# Patient Record
Sex: Female | Born: 1945 | Race: Black or African American | Hispanic: No | Smoking: Never smoker
Health system: Southern US, Community
[De-identification: ages and names within clinical notes are randomized; demographics above are authoritative.]

## PROBLEM LIST (undated history)

## (undated) DIAGNOSIS — I10 Essential (primary) hypertension: Secondary | ICD-10-CM

## (undated) DIAGNOSIS — R011 Cardiac murmur, unspecified: Secondary | ICD-10-CM

## (undated) DIAGNOSIS — I421 Obstructive hypertrophic cardiomyopathy: Secondary | ICD-10-CM

## (undated) DIAGNOSIS — E119 Type 2 diabetes mellitus without complications: Secondary | ICD-10-CM

## (undated) HISTORY — DX: Cardiac murmur, unspecified: R01.1

## (undated) HISTORY — DX: Obstructive hypertrophic cardiomyopathy: I42.1

## (undated) HISTORY — DX: Essential (primary) hypertension: I10

## (undated) HISTORY — PX: ABDOMINAL HYSTERECTOMY: SHX81

## (undated) HISTORY — DX: Type 2 diabetes mellitus without complications: E11.9

---

## 2004-04-07 ENCOUNTER — Encounter: Admission: RE | Admit: 2004-04-07 | Discharge: 2004-04-07 | Payer: Self-pay | Admitting: Internal Medicine

## 2005-02-08 ENCOUNTER — Ambulatory Visit (HOSPITAL_COMMUNITY): Admission: RE | Admit: 2005-02-08 | Discharge: 2005-02-08 | Payer: Self-pay | Admitting: Gastroenterology

## 2006-03-25 ENCOUNTER — Emergency Department (HOSPITAL_COMMUNITY): Admission: EM | Admit: 2006-03-25 | Discharge: 2006-03-25 | Payer: Self-pay | Admitting: Emergency Medicine

## 2006-03-29 ENCOUNTER — Ambulatory Visit (HOSPITAL_COMMUNITY): Admission: RE | Admit: 2006-03-29 | Discharge: 2006-03-30 | Payer: Self-pay | Admitting: Orthopaedic Surgery

## 2008-12-27 ENCOUNTER — Observation Stay (HOSPITAL_COMMUNITY): Admission: EM | Admit: 2008-12-27 | Discharge: 2008-12-30 | Payer: Self-pay | Admitting: Emergency Medicine

## 2008-12-29 HISTORY — PX: CARDIAC CATHETERIZATION: SHX172

## 2009-07-27 ENCOUNTER — Encounter: Admission: RE | Admit: 2009-07-27 | Discharge: 2009-07-27 | Payer: Self-pay | Admitting: Internal Medicine

## 2010-02-08 ENCOUNTER — Emergency Department (HOSPITAL_COMMUNITY): Admission: EM | Admit: 2010-02-08 | Discharge: 2010-02-08 | Payer: Self-pay | Admitting: Emergency Medicine

## 2010-10-10 ENCOUNTER — Encounter: Payer: Self-pay | Admitting: Internal Medicine

## 2010-10-26 ENCOUNTER — Other Ambulatory Visit: Payer: Self-pay | Admitting: Internal Medicine

## 2010-10-26 DIAGNOSIS — Z1231 Encounter for screening mammogram for malignant neoplasm of breast: Secondary | ICD-10-CM

## 2010-11-04 ENCOUNTER — Ambulatory Visit
Admission: RE | Admit: 2010-11-04 | Discharge: 2010-11-04 | Disposition: A | Discharge status: Home / Self Care | Payer: BC Managed Care – PPO | Source: Ambulatory Visit | Attending: Internal Medicine | Admitting: Internal Medicine

## 2010-11-04 DIAGNOSIS — Z1231 Encounter for screening mammogram for malignant neoplasm of breast: Secondary | ICD-10-CM

## 2010-12-06 LAB — URINALYSIS, ROUTINE W REFLEX MICROSCOPIC
Bilirubin Urine: NEGATIVE
Glucose, UA: NEGATIVE mg/dL
Hgb urine dipstick: NEGATIVE
Protein, ur: NEGATIVE mg/dL
Specific Gravity, Urine: 1.022 (ref 1.005–1.030)

## 2010-12-29 LAB — COMPREHENSIVE METABOLIC PANEL
ALT: 22 U/L (ref 0–35)
AST: 17 U/L (ref 0–37)
BUN: 24 mg/dL — ABNORMAL HIGH (ref 6–23)
BUN: 26 mg/dL — ABNORMAL HIGH (ref 6–23)
CO2: 24 mEq/L (ref 19–32)
Calcium: 8.8 mg/dL (ref 8.4–10.5)
Chloride: 103 mEq/L (ref 96–112)
Creatinine, Ser: 1.11 mg/dL (ref 0.4–1.2)
Creatinine, Ser: 1.12 mg/dL (ref 0.4–1.2)
GFR calc Af Amer: 60 mL/min — ABNORMAL LOW (ref >60)
GFR calc Af Amer: >60 mL/min (ref >60)
GFR calc non Af Amer: 50 mL/min — ABNORMAL LOW (ref >60)
Glucose, Bld: 228 mg/dL — ABNORMAL HIGH (ref 70–99)
Potassium: 5.7 mEq/L — ABNORMAL HIGH (ref 3.5–5.1)
Total Bilirubin: 0.5 mg/dL (ref 0.3–1.2)
Total Bilirubin: 1.3 mg/dL — ABNORMAL HIGH (ref 0.3–1.2)
Total Protein: 5.8 g/dL — ABNORMAL LOW (ref 6.0–8.3)

## 2010-12-29 LAB — POCT CARDIAC MARKERS
CKMB, poc: 2.8 ng/mL (ref 1.0–8.0)
Troponin i, poc: <0.05 ng/mL (ref 0.00–0.09)

## 2010-12-29 LAB — CBC
HCT: 33.4 % — ABNORMAL LOW (ref 36.0–46.0)
MCHC: 31.6 g/dL (ref 30.0–36.0)
MCHC: 31.8 g/dL (ref 30.0–36.0)
MCHC: 32.8 g/dL (ref 30.0–36.0)
MCV: 70.4 fL — ABNORMAL LOW (ref 78.0–100.0)
MCV: 71.6 fL — ABNORMAL LOW (ref 78.0–100.0)
MCV: 71.7 fL — ABNORMAL LOW (ref 78.0–100.0)
MCV: 72.1 fL — ABNORMAL LOW (ref 78.0–100.0)
Platelets: 196 10*3/uL (ref 150–400)
Platelets: 213 10*3/uL (ref 150–400)
Platelets: 235 10*3/uL (ref 150–400)
RBC: 4.91 MIL/uL (ref 3.87–5.11)
RBC: 5.14 MIL/uL — ABNORMAL HIGH (ref 3.87–5.11)
RDW: 16.1 % — ABNORMAL HIGH (ref 11.5–15.5)
RDW: 16.6 % — ABNORMAL HIGH (ref 11.5–15.5)
WBC: 6.9 10*3/uL (ref 4.0–10.5)
WBC: 7.6 10*3/uL (ref 4.0–10.5)
WBC: 8.5 10*3/uL (ref 4.0–10.5)

## 2010-12-29 LAB — GLUCOSE, CAPILLARY
Glucose-Capillary: 179 mg/dL — ABNORMAL HIGH (ref 70–99)
Glucose-Capillary: 198 mg/dL — ABNORMAL HIGH (ref 70–99)
Glucose-Capillary: 216 mg/dL — ABNORMAL HIGH (ref 70–99)
Glucose-Capillary: 217 mg/dL — ABNORMAL HIGH (ref 70–99)
Glucose-Capillary: 253 mg/dL — ABNORMAL HIGH (ref 70–99)
Glucose-Capillary: 80 mg/dL (ref 70–99)

## 2010-12-29 LAB — CARDIAC PANEL(CRET KIN+CKTOT+MB+TROPI)
CK, MB: 3.1 ng/mL (ref 0.3–4.0)
Relative Index: 1.9 (ref 0.0–2.5)
Relative Index: 2.1 (ref 0.0–2.5)
Total CK: 138 U/L (ref 7–177)
Total CK: 139 U/L (ref 7–177)
Troponin I: <0.01 ng/mL (ref 0.00–0.06)
Troponin I: <0.01 ng/mL (ref 0.00–0.06)
Troponin I: <0.01 ng/mL (ref 0.00–0.06)

## 2010-12-29 LAB — URINALYSIS, ROUTINE W REFLEX MICROSCOPIC
Bilirubin Urine: NEGATIVE
Hgb urine dipstick: NEGATIVE
Nitrite: NEGATIVE
Specific Gravity, Urine: 1.021 (ref 1.005–1.030)
Urobilinogen, UA: 0.2 mg/dL (ref 0.0–1.0)

## 2010-12-29 LAB — LIPASE, BLOOD: Lipase: 57 U/L (ref 11–59)

## 2010-12-29 LAB — BASIC METABOLIC PANEL
BUN: 31 mg/dL — ABNORMAL HIGH (ref 6–23)
CO2: 22 mEq/L (ref 19–32)
Chloride: 105 mEq/L (ref 96–112)
GFR calc non Af Amer: 38 mL/min — ABNORMAL LOW (ref >60)
Glucose, Bld: 248 mg/dL — ABNORMAL HIGH (ref 70–99)
Potassium: 4.2 mEq/L (ref 3.5–5.1)

## 2010-12-29 LAB — DIFFERENTIAL
Basophils Absolute: 0 10*3/uL (ref 0.0–0.1)
Monocytes Relative: 7 % (ref 3–12)
Neutro Abs: 4.9 10*3/uL (ref 1.7–7.7)
Neutrophils Relative %: 58 % (ref 43–77)

## 2010-12-29 LAB — PROTIME-INR: INR: 0.9 (ref 0.00–1.49)

## 2011-02-01 NOTE — H&P (Signed)
Emily Mathews, Emily Mathews            ACCOUNT NO.:  [id number]   MEDICAL RECORD NO.:  [id number]          PATIENT TYPE:  INP   LOCATION:                               FACILITY:  [facility]   PHYSICIAN:  Thereasa Solo. Little, M.D. DATE OF BIRTH:  1946/02/16   DATE OF ADMISSION:  12/27/2008  DATE OF DISCHARGE:                              HISTORY & PHYSICAL   This 65 year old female was hospitalized on December 29, 2008, with  complains of chest pain.  Two days prior to admission, she had a chest  pressure that was relatively short in duration and resolved  spontaneously.  On the day of admission, she had more significant chest  pain.  She felt it was slightly sharper and burning.  It was in the  substernal area.  There was no associated diaphoresis or shortness of  breath with this.  This episode lasted over 30 minutes, and she was  brought to the ER by EMS.   ALLERGIES:  None.   MEDICAL PROBLEMS:  Hypertension x15 years, hyperlipidemia, insulin-  dependent diabetes mellitus x15 years, history of hysterectomy, history  of a fractured wrist, history of hypertension that occurred during her  last pregnancy.   She did not have rheumatic fever as a child.  She knows of a heart  murmur that she has had for years, this has never been evaluated.   CURRENT MEDICATIONS:  1. Metformin 500 b.i.d.  2. Hydrochlorothiazide 25.  3. Benicar 40.  4. Crestor 10.  5. Amlodipine 5.  6. Amaryl 2.5 mg a day.  7. Humalog 50/50, 20 units b.i.d.  8. Protonix 40.  9. Lovenox 40 mg b.i.d.   FAMILY HISTORY:  Her mother had heart disease in her 22s.   SOCIAL HISTORY:  She works as a Lawyer.  She is married with children.  Her  son lives with her.  She is active.  No tobacco products or marijuana.   REVIEW OF SYSTEMS:  No claudication.  No lower extremity edema.  Rare  palpitations that are infrequent.  No dizziness.  No syncope.  She does  not have a productive cough.  No abdominal pain.  She has occasional  nocturia.  No dysuria.  She denies PND or orthopnea.  She did have a  seasonal flu vaccine.  Her weight has increased about 10 pounds over the  last 6 months.  With this, she has a little more breathless with  sustained activity.   She has had no problems with bleeding.   PHYSICAL EXAMINATION:  VITAL SIGNS:  Blood pressure sitting is 112/74,  heart rate is 74.  Her respiratory rate is 18.  Her temperature is 97.6.  GENERAL:  Her skin is warm and dry.  She is alert.  Communicative,  talkative, and her mood is upbeat.  NECK:  No carotid bruits.  Supple.  LUNGS:  Clear with no rales or wheezes.  CARDIAC:  Regular heart rhythm.  There was a 2/6 systolic murmur that I  hear at the lower left and mid left sternal border.  It does not radiate  to the neck or towards the  axilla.  It is less audible in the supine  position.  ABDOMEN:  Soft.  There is no abdominal bruits.  I could not palpate the  liver edge.  MUSCULOSKELETAL:  Grossly normal.  NEUROLOGIC:  Grossly normal.   EKG shows ST-T wave changes in the inferior and lateral leads.  The  changes in 1 and L are significantly more noticeable than in 2007.  The  other ST-T wave abnormalities are cold.  These may well represent some  hypertensive changes since they were present in 2007.   ASSESSMENT:  1. Chest pain with multiple cardiac risk factors and worsening      abnormalities on a resting EKG.  2. Insulin-dependent diabetes mellitus according to the patient under      less and ideal control.  3. Hypertension, well controlled today.  4. Hyperlipidemia.  5. Premature family history of coronary disease.  6. Murmur:  She will need a 2-D echo as an outpatient to assess this.      She could have left ventricular hypertrophy with potentially some      outflow gradient as the reason for her murmur.   I discussed a cardiac catheterization with her.  We plan to proceed on  with this later today.  She understands and accepts the  risk.           ______________________________  Thereasa Solo. Little, M.D.     ABL/MEDQ  D:  12/29/2008  T:  12/30/2008  Job:  [id number]   cc:   Fleet Contras, M.D.

## 2011-02-01 NOTE — Cardiovascular Report (Signed)
[name], Mathews            ACCOUNT NO.:  [id number]   MEDICAL RECORD NO.:  [id number]           PATIENT TYPE:   LOCATION:                                 FACILITY:   PHYSICIAN:  Thereasa Solo. Little, M.D. DATE OF BIRTH:  05-23-1946   DATE OF PROCEDURE:  12/29/2008  DATE OF DISCHARGE:                            CARDIAC CATHETERIZATION   This 65 year old female presented with a chest discomfort, had an  abnormal EKG.  Because of this, she was brought to the cath lab.   PROCEDURE:  The patient was prepped and draped in a usual sterile  fashion, exposed the right groin following local anesthetic with 1%  Xylocaine, the Seldinger technique was employed, a 5-French introducer  sheath placed in the right femoral artery.  Left and right coronary  arteriography and ventriculography were performed.  At the time of the  ventriculogram, she had near total obliteration of the distal half of  the LV cavity and because of this, a multipurpose catheter was placed  into the apex of the heart, and hemodynamic monitoring was undertaken at  the apex of valvular and supravalvular areas.   COMPLICATIONS:  None.   TOTAL CONTRAST USED:  75 mL.   A 5-French Judkins configuration catheters with a multipurpose catheter  used for hemodynamic monitoring.   RESULTS:  1. Hemodynamic monitoring:  Central aortic pressure was 91/53.  Left      ventricular pressure in the subvalvular area was 124/8.  At the      apex of the heart, her intracardiac pressure was 185/10.  There was      no significant valve gradient noted at the time of pullback.  She      has a 120 mm post PVC accentuation of her left ventricular systolic      pressure.  Pre PVC 120/80, post PVC 240/10.   1. Ventriculography:  The LV was hyperdynamic with a total      obliteration of distal half of the LV cavity.  This was consistent      with a hypertrophic myopathy.  2. Coronary arteriography:      a.     Left main normal.      b.      LAD extended down to the apex of the heart, gave rise to       first diagonal.  The system was free of disease.      c.     Circumflex:  The circumflex was a large 4+ mm vessel.  It       was a codominant vessel, and it was free of disease.  There was a       large first OM.      d.     RCA:  The RCA was normal.  It gave rise to small PDA.   CONCLUSION:  Hypertrophic cardiomyopathy with a 60-mm subvalvular to  apex gradient.  She will need echocardiogram as an outpatient.  She will  be started on metoprolol 50 mg and will need a rather substantial push  in  her cardiac medications.  I do recognize that  her systolic pressure is  already relatively low, but the Diovan will do very little to help with  the cavity obliteration and her beta blockers and even rate slowing  calcium channel blockers would.           ______________________________  Thereasa Solo. Little, M.D.     ABL/MEDQ  D:  01/21/2009  T:  01/22/2009  Job:  [id number]   cc:   Fleet Contras, M.D.  Cath Lab

## 2011-02-01 NOTE — Discharge Summary (Signed)
NAMEADREA, Emily Mathews NO.:  [id number]   MEDICAL RECORD NO.:  [id number]          PATIENT TYPE:  INP   LOCATION:  2035                         FACILITY:  [facility]   PHYSICIAN:  Fleet Contras, M.D.    DATE OF BIRTH:  August 11, 1946   DATE OF ADMISSION:  12/27/2008  DATE OF DISCHARGE:  12/30/2008                               DISCHARGE SUMMARY   HISTORY OF PRESENTING ILLNESS:  Emily Mathews is a 65 year old African  American lady with multiple medical problems including type 2 diabetes  mellitus, hypertension, dyslipidemia, and family history of premature  coronary artery disease.  She came to the emergency room with 2 days of  central substernal chest pain which was burning in nature after she ate.  This lasted for about 30 minutes and she thought that this was mostly  due to acid reflux.  She took some over-the-counter medication, but her  symptoms did not resolve and tried to come to the emergency room for  further evaluation.  At the emergency room, vital signs were stable.  She was not in any acute respiratory or painful distress.  Laboratory  data revealed negative cardiac enzymes, but also new changes on her EKG  with more ST-T wave changes.  She was therefore admitted to the hospital  for further evaluation to rule out coronary syndrome.   HOSPITAL COURSE:  On admission, she was placed on telemetry and started  on subcutaneous Lovenox for unstable angina.  Serial CPK and troponin  were done x3 and was negative.  EKG continued to show inverted T-waves  in the lateral leads, but she had no further chest pain.  She was  started on Protonix 40 mg daily which seemed to relieve her pain;  however, in review of her multiple risk factors for CAD, a cardiology  consult was requested and the patient was seen by Dr. Allyson Sabal, who  proceeded with cardiac catheterization on December 29, 2008.  This revealed  normal coronaries with hyperdynamic left ventricle with total  obliteration of  distal half of the left ventricular cavity suggestive of  hypertrophic cardiomyopathy with 60-mm aortic outflow obstruction in the  mid cavity.  She was therefore taken off the Diovan and started on  Toprol-XL 50 mg daily.  She is to have outpatient 2-D echocardiogram  performed in the office.  Today, she is feeling much better.  She has no  further chest pain or shortness of breath, and right groin is pain free.   Vital signs showed a blood pressure of 128/85, heart rate of 78,  respiratory rate of 20, temperature 97.9, O2 sats on room air is 98%.  CBG is 216.  Chest is clear to auscultation.  Heart sounds S1 and S2 are  heard with no murmurs.  Abdomen is soft and nontender.  Bowel sounds are  present.  The right groin shows no hematoma.  Distal pulses are present  bilaterally.  She is alert and oriented x3 with no focal neurologic  deficits.   Laboratory data from this morning shows a white count of 7.1, hemoglobin  11, hematocrit 33.4, platelet count of 198.  Sodium is 136, potassium  4.2, chloride 105, bicarbonate of 22, BUN is 31, creatinine 1.4, glucose  is 248.  She is now considered stable for discharge home today.   DISCHARGE DIAGNOSES:  1. Atypical chest pain now, resolved.  2. Hypertrophic cardiomyopathy for 2-D echocardiogram in the      outpatient.  3. Systemic hypertension.  4. Type 2 diabetes mellitus.  5. Mild renal insufficiency.   PLAN OF CARE:  She was continued with IV fluids prior to discharge home.  She has been advised to hold metformin for the next 48 hours and to  follow up with my office on Friday, January 02, 2009, for check on her  renal panel.  She is also to follow up with Dr. Allyson Sabal in the office for  a 2-D echocardiogram.   Her discharge medications include:  1. Glimepiride 2.5 mg daily p.o.  2. Metformin 500 mg b.i.d. which should be held for 2 days.  3. Humulin 50/50, 30 units in a.m. and 20 units in p.m.  4. Caduet 5/20 one p.o. daily.  5.  Toprol-XL 50 mg daily.  6. Prilosec 20 mg 2 p.o. daily.  7. Diovan HCT has been discontinued.   The discharge plan have been discussed with her and her husband who is  at her bedside and their questions answered.       Fleet Contras, M.D.  Electronically Signed     EA/MEDQ  D:  12/30/2008  T:  12/30/2008  Job:  [id number]

## 2011-02-04 NOTE — Op Note (Signed)
NAMESHEREECE, WELLBORN            ACCOUNT NO.:  [id number]   MEDICAL RECORD NO.:  [id number]          PATIENT TYPE:  OIB   LOCATION:  5031                         FACILITY:  [facility]   PHYSICIAN:  Mark C. Ophelia Charter, M.D.    DATE OF BIRTH:  [birth year]   DATE OF PROCEDURE:  03/29/2006  DATE OF DISCHARGE:  03/30/2006                                 OPERATIVE REPORT   PREOPERATIVE DIAGNOSIS:  Angulated displaced intraarticular distal radius  fracture.   POSTOPERATIVE DIAGNOSIS:  Angulated displaced intraarticular distal radius  fracture.   PROCEDURE:  Open reduction/internal fixation and volar anatomic plating,  left distal radius.   SURGEON:  Annell Greening, M.D.   ANESTHESIA:  GOT plus Marcaine skin local.   TOURNIQUET TIME:  34 minutes x250 TORR.   PROCEDURE:  After induction of general anesthesia and preoperative Ancef  prophylaxis, proximal arm tourniquet, and standard prepping with DuraPrep,  the usual extremity drapes were applied.  A sterile skin mark was used, and  a volar approach was made, splitting through the anterior FCR sheath and  then posterior FCR sheath.  The pronator was pulled off from the radial side  toward the ulna, and a baby Hohmann retractor was placed.  The fracture was  identified displaced and angulated.  Reduction was performed with just  traction re-creation and pushing dorsally width length and correcting the  angulation, confirmed under fluoroscopy.  The shorter of the plates were  selected, DVR, and positioned.  A 14-mm sliding screw was inserted,  adjusted, pulled back slightly based on the fluoroscopic views, and then the  distal row was filled from ulnar to radial side with 20-mm threaded screws  followed by an 18-mm styloid screw.  Smooth pegs used in the proximal rows.  Two remaining cortical radius screws were placed, one 14 distal and then the  proximal one was a 12.  Entire apparatus was checked under fluoroscopy with  good position and alignment.   No screws penetrated through the joint and  after irrigation with saline solution, tourniquet was deflated and  hemostasis was obtained using the bipolar cautery.  Vicryl 2-0 for the  subcutaneous tissue, 4-0 Vicryl running subcuticular skin closure, tincture  of benzoin and Steri-Strips with Marcaine infiltration.  Postop dressing  with tweeners, Webril,  4x4s, wrist splint dorsally, and Ace wrap.  Instrument count and needle count was correct.      Mark C. Ophelia Charter, M.D.  Electronically Signed     MCY/MEDQ  D:  03/29/2006  T:  03/30/2006  Job:  [id number]

## 2011-09-07 ENCOUNTER — Ambulatory Visit (INDEPENDENT_AMBULATORY_CARE_PROVIDER_SITE_OTHER): Payer: BC Managed Care – PPO

## 2011-09-07 DIAGNOSIS — J069 Acute upper respiratory infection, unspecified: Secondary | ICD-10-CM

## 2011-09-07 DIAGNOSIS — R05 Cough: Secondary | ICD-10-CM

## 2011-12-27 ENCOUNTER — Other Ambulatory Visit: Payer: Self-pay | Admitting: Family Medicine

## 2011-12-27 DIAGNOSIS — Z1231 Encounter for screening mammogram for malignant neoplasm of breast: Secondary | ICD-10-CM

## 2012-01-02 ENCOUNTER — Ambulatory Visit
Admission: RE | Admit: 2012-01-02 | Discharge: 2012-01-02 | Disposition: A | Discharge status: Home / Self Care | Payer: BC Managed Care – PPO | Source: Ambulatory Visit | Attending: Family Medicine | Admitting: Family Medicine

## 2012-01-02 DIAGNOSIS — Z1231 Encounter for screening mammogram for malignant neoplasm of breast: Secondary | ICD-10-CM

## 2012-09-19 HISTORY — PX: TRANSTHORACIC ECHOCARDIOGRAM: SHX275

## 2012-09-26 ENCOUNTER — Other Ambulatory Visit (HOSPITAL_COMMUNITY): Payer: Self-pay | Admitting: Internal Medicine

## 2012-09-26 DIAGNOSIS — I429 Cardiomyopathy, unspecified: Secondary | ICD-10-CM

## 2012-10-05 ENCOUNTER — Ambulatory Visit (HOSPITAL_COMMUNITY)
Admission: RE | Admit: 2012-10-05 | Discharge: 2012-10-05 | Disposition: A | Discharge status: Home / Self Care | Payer: BC Managed Care – PPO | Source: Ambulatory Visit | Attending: Internal Medicine | Admitting: Internal Medicine

## 2012-10-05 DIAGNOSIS — I429 Cardiomyopathy, unspecified: Secondary | ICD-10-CM

## 2012-10-05 DIAGNOSIS — I428 Other cardiomyopathies: Secondary | ICD-10-CM | POA: Insufficient documentation

## 2012-10-05 NOTE — Progress Notes (Signed)
[facility]   2D echo completed 10/05/2012.   Cindy Zaleigh Bermingham, RDCS   

## 2013-02-18 ENCOUNTER — Telehealth (HOSPITAL_COMMUNITY): Payer: Self-pay | Admitting: Internal Medicine

## 2013-02-18 ENCOUNTER — Other Ambulatory Visit: Payer: Self-pay | Admitting: *Deleted

## 2013-02-18 MED ORDER — DILTIAZEM HCL ER COATED BEADS 240 MG PO CP24: 240.0000 mg | ORAL_CAPSULE | Freq: Every day | ORAL | Status: DC

## 2013-02-18 NOTE — Telephone Encounter (Signed)
Refill for cardizem

## 2013-02-19 NOTE — Telephone Encounter (Signed)
Refill completed.

## 2013-03-12 ENCOUNTER — Other Ambulatory Visit: Payer: Self-pay

## 2013-03-12 MED ORDER — LOSARTAN POTASSIUM-HCTZ 100-25 MG PO TABS: 1.0000 | ORAL_TABLET | Freq: Every day | ORAL | Status: DC

## 2013-03-12 NOTE — Telephone Encounter (Signed)
Rx was sent to pharmacy electronically. 

## 2013-04-23 ENCOUNTER — Telehealth: Payer: Self-pay | Admitting: Internal Medicine

## 2013-04-23 ENCOUNTER — Other Ambulatory Visit: Payer: Self-pay

## 2013-04-23 MED ORDER — METOPROLOL TARTRATE 50 MG PO TABS: 75.0000 mg | ORAL_TABLET | Freq: Two times a day (BID) | ORAL | Status: DC

## 2013-04-23 NOTE — Telephone Encounter (Signed)
Refill sent electronically at 1:30p. Called the pharmacy to make sure refill was received and was notified that it is still in progress.

## 2013-04-23 NOTE — Telephone Encounter (Signed)
Pt called pharmacy yesterday and they faxed refill request for Metoprolol 50 mg. Drug store has not heard back from [place].  She is out of her medication. [facility]  [address].

## 2013-04-23 NOTE — Telephone Encounter (Signed)
Rx was sent to pharmacy electronically. 

## 2013-09-29 ENCOUNTER — Other Ambulatory Visit: Payer: Self-pay | Admitting: Internal Medicine

## 2013-09-30 NOTE — Telephone Encounter (Signed)
Rx was sent to pharmacy electronically. 

## 2013-10-11 ENCOUNTER — Ambulatory Visit (INDEPENDENT_AMBULATORY_CARE_PROVIDER_SITE_OTHER): Payer: BC Managed Care – PPO | Admitting: Internal Medicine

## 2013-10-11 VITALS — BP 120/80 | HR 62 | Temp 98.2°F | Resp 16 | Ht 60.0 in | Wt 141.0 lb

## 2013-10-11 DIAGNOSIS — E119 Type 2 diabetes mellitus without complications: Secondary | ICD-10-CM | POA: Insufficient documentation

## 2013-10-11 DIAGNOSIS — R197 Diarrhea, unspecified: Secondary | ICD-10-CM

## 2013-10-11 DIAGNOSIS — I10 Essential (primary) hypertension: Secondary | ICD-10-CM

## 2013-10-11 DIAGNOSIS — R109 Unspecified abdominal pain: Secondary | ICD-10-CM

## 2013-10-11 LAB — POCT UA - MICROSCOPIC ONLY
Casts, Ur, LPF, POC: NEGATIVE
Crystals, Ur, HPF, POC: NEGATIVE
Yeast, UA: NEGATIVE

## 2013-10-11 LAB — POCT CBC
GRANULOCYTE PERCENT: 57.5 % (ref 37–80)
HCT, POC: 47 % (ref 37.7–47.9)
Hemoglobin: 13.9 g/dL (ref 12.2–16.2)
Lymph, poc: 2.7 (ref 0.6–3.4)
MCH, POC: 23.3 pg — AB (ref 27–31.2)
MCHC: 29.6 g/dL — AB (ref 31.8–35.4)
MCV: 78.8 fL — AB (ref 80–97)
MID (CBC): 0.6 (ref 0–0.9)
MPV: 9.4 fL (ref 0–99.8)
PLATELET COUNT, POC: 228 10*3/uL (ref 142–424)
POC GRANULOCYTE: 4.5 (ref 2–6.9)
POC LYMPH PERCENT: 34.8 %L (ref 10–50)
POC MID %: 7.7 % (ref 0–12)
RBC: 5.97 M/uL — AB (ref 4.04–5.48)
RDW, POC: 15.5 %
WBC: 7.9 10*3/uL (ref 4.6–10.2)

## 2013-10-11 LAB — POCT URINALYSIS DIPSTICK
Bilirubin, UA: NEGATIVE
Blood, UA: NEGATIVE
GLUCOSE UA: NEGATIVE
KETONES UA: NEGATIVE
Nitrite, UA: NEGATIVE
Protein, UA: NEGATIVE
SPEC GRAV UA: 1.025
UROBILINOGEN UA: 0.2
pH, UA: 5.5

## 2013-10-11 MED ORDER — DICYCLOMINE HCL 20 MG PO TABS: 20.0000 mg | ORAL_TABLET | Freq: Four times a day (QID) | ORAL | Status: DC

## 2013-10-11 MED ORDER — ONDANSETRON HCL 4 MG PO TABS: 4.0000 mg | ORAL_TABLET | Freq: Three times a day (TID) | ORAL | Status: DC | PRN

## 2013-10-11 NOTE — Progress Notes (Addendum)
Subjective:    Patient ID: Emily Mathews, female    DOB: 1946/06/28, 68 y.o.   MRN: [id number] This chart was scribed for Emily Siaobert Shaleta Ruacho, MD by Valera CastleSteven Perry, ED Scribe. This patient was seen in room 08 and the patient's care was started at 10:28 AM.  Chief Complaint  Patient presents with  . Emesis    x 3 days  . Diarrhea    x 3 days  . Abdominal Pain    x 3 days    HPI Emily BameJosephine Mathews is a 68 y.o. female who [facility] complaining of intermittent abdominal pain across her abdomen, with associated nausea, vomiting, diarrhea, chills, diaphoresis, and decreased appetite, onset 3 days ago. She denies current nausea. She states she has not eaten anything yet today. She denies eating any unusual foods, suspicious foods which may have caused her illness. She reports mild, intermittent cough, and pain when she coughs. She reports there have been some people at work with similar GI symptoms. She denies urinary symptoms, SOB, and any other associated symptoms. She reports being on Glucophage 500 mg, Humalog 75/25 for her h/o DM.   PCP - No primary provider on file.  There are no active problems to display for this patient.  Prior to Admission medications   Medication Sig Start Date End Date Taking? Authorizing Provider  diltiazem (CARDIZEM CD) 240 MG 24 hr capsule TAKE ONE CAPSULE BY MOUTH ONCE DAILY 09/29/13  Yes Chrystie NoseKenneth C. Hilty, MD  insulin lispro protamine-lispro (HUMALOG 75/25 MIX) (75-25) 100 UNIT/ML SUSP injection Inject into the skin.   Yes Historical Provider, MD  metFORMIN (GLUCOPHAGE) 500 MG tablet Take by mouth 2 (two) times daily with a meal.   Yes Historical Provider, MD  losartan-hydrochlorothiazide (HYZAAR) 100-25 MG per tablet Take 1 tablet by mouth daily. 03/12/13   Chrystie NoseKenneth C. Hilty, MD  metoprolol (LOPRESSOR) 50 MG tablet Take 1.5 tablets (75 mg total) by mouth 2 (two) times daily. 04/23/13   Chrystie NoseKenneth C. Hilty, MD    Review of Systems  Constitutional: Positive  for chills, diaphoresis and appetite change (decreased).  Respiratory: Positive for cough. Negative for shortness of breath.   Gastrointestinal: Positive for nausea, vomiting, abdominal pain and diarrhea.  Genitourinary: Negative.  Negative for dysuria and frequency.       Objective:   Physical Exam  Nursing note and vitals reviewed. Constitutional: She is oriented to person, place, and time. She appears well-developed and well-nourished. No distress.  HENT:  Head: Normocephalic and atraumatic.  Eyes: EOM are normal.  Neck: Neck supple.  Cardiovascular: Normal rate, regular rhythm and normal heart sounds.   No murmur heard. Pulmonary/Chest: Effort normal and breath sounds normal. No respiratory distress. She has no wheezes. She has no rales.  Abdominal: Soft. There is tenderness. There is no rebound.  Mild bilateral LQ tenderness, without rebound.  Musculoskeletal: Normal range of motion.  Neurological: She is alert and oriented to person, place, and time.  Skin: Skin is warm and dry.  Psychiatric: She has a normal mood and affect. Her behavior is normal.    BP 120/80  Pulse 62  Temp(Src) 98.2 F (36.8 C) (Oral)  Resp 16  Ht 5' (1.524 m)  Wt 141 lb (63.957 kg)  BMI 27.54 kg/m2  SpO2 98%  Results for orders placed in visit on 10/11/13  POCT CBC      Result Value Range   WBC 7.9  4.6 - 10.2 K/uL   Lymph, poc 2.7  0.6 -  3.4   POC LYMPH PERCENT 34.8  10 - 50 %L   MID (cbc) 0.6  0 - 0.9   POC MID % 7.7  0 - 12 %M   POC Granulocyte 4.5  2 - 6.9   Granulocyte percent 57.5  37 - 80 %G   RBC 5.97 (*) 4.04 - 5.48 M/uL   Hemoglobin 13.9  12.2 - 16.2 g/dL   HCT, POC 16.1  09.6 - 47.9 %   MCV 78.8 (*) 80 - 97 fL   MCH, POC 23.3 (*) 27 - 31.2 pg   MCHC 29.6 (*) 31.8 - 35.4 g/dL   RDW, POC 04.5     Platelet Count, POC 228  142 - 424 K/uL   MPV 9.4  0 - 99.8 fL  POCT UA - MICROSCOPIC ONLY      Result Value Range   WBC, Ur, HPF, POC 2-6     RBC, urine, microscopic 0-1      Bacteria, U Microscopic trace     Mucus, UA small     Epithelial cells, urine per micros 0-3     Crystals, Ur, HPF, POC neg     Casts, Ur, LPF, POC neg     Yeast, UA neg    POCT URINALYSIS DIPSTICK      Result Value Range   Color, UA yellow     Clarity, UA clear     Glucose, UA neg     Bilirubin, UA neg     Ketones, UA neg     Spec Grav, UA 1.025     Blood, UA neg     pH, UA 5.5     Protein, UA neg     Urobilinogen, UA 0.2     Nitrite, UA neg     Leukocytes, UA small (1+)        Assessment & Plan:   1. Abdominal pain   2. Diarrhea     Meds ordered this encounter  Medications  . ondansetron (ZOFRAN) 4 MG tablet    Sig: Take 1 tablet (4 mg total) by mouth every 8 (eight) hours as needed for nausea or vomiting.    Dispense:  12 tablet    Refill:  0  . dicyclomine (BENTYL) 20 MG tablet    Sig: Take 1 tablet (20 mg total) by mouth every 6 (six) hours. For abdominal cramping    Dispense:  12 tablet    Refill:  0  rtw Monday    I have completed the patient encounter in its entirety as documented by the scribe, with editing by me where necessary. Dolph Tavano P. Merla Riches, M.D.

## 2013-11-19 ENCOUNTER — Telehealth (HOSPITAL_COMMUNITY): Payer: Self-pay | Admitting: *Deleted

## 2013-12-23 ENCOUNTER — Other Ambulatory Visit: Payer: Self-pay | Admitting: Internal Medicine

## 2013-12-23 ENCOUNTER — Telehealth (HOSPITAL_COMMUNITY): Payer: Self-pay | Admitting: *Deleted

## 2013-12-25 NOTE — Telephone Encounter (Signed)
Rx was sent to pharmacy electronically. 

## 2014-01-03 ENCOUNTER — Other Ambulatory Visit (HOSPITAL_COMMUNITY): Payer: Self-pay | Admitting: Internal Medicine

## 2014-01-03 ENCOUNTER — Telehealth: Payer: Self-pay | Admitting: Internal Medicine

## 2014-01-03 DIAGNOSIS — I429 Cardiomyopathy, unspecified: Secondary | ICD-10-CM

## 2014-01-03 MED ORDER — LOSARTAN POTASSIUM-HCTZ 100-25 MG PO TABS: 1.0000 | ORAL_TABLET | Freq: Every day | ORAL | Status: DC

## 2014-01-03 MED ORDER — DILTIAZEM HCL ER COATED BEADS 240 MG PO CP24: ORAL_CAPSULE | ORAL | Status: DC

## 2014-01-03 MED ORDER — METOPROLOL TARTRATE 50 MG PO TABS: 75.0000 mg | ORAL_TABLET | Freq: Two times a day (BID) | ORAL | Status: DC

## 2014-01-03 NOTE — Telephone Encounter (Signed)
Returned a call to patient. She is requesting a refill on her medications. I informed her that she will need appointment. I will refill enough until she can be seen. Patient voiced understanding. [facility] [address]

## 2014-01-03 NOTE — Telephone Encounter (Signed)
Pt has appt for echo in June.  Needs refills for all her medicine...doesn't have the list with her.   [facility] [address].

## 2014-01-15 ENCOUNTER — Telehealth: Payer: Self-pay | Admitting: *Deleted

## 2014-01-15 NOTE — Telephone Encounter (Signed)
Patient stopped by and left a "yellow sheet" for someone to call her in reference to one her her prescriptions that she does know the name of. When I returned a call to her she informed me that sh has been trying to get a prescription refilled that she takes "1/2 of." I reviewed her meds on the computer and saw nothing that she was taking  1/2 of. Upon reviewing her paper chart it is noted that Dr. Clarene DukeLittle had her to take her losartan-hct 100-25 0.5 tablet daily. ( the computer med list does not reflect this.) after speaking with her [facility] they did confirm that they do have a prescription on file for her. I requested for them to fill it for he patient. She was notified of this and will check the pharmacy later today for pick up.

## 2014-01-30 ENCOUNTER — Encounter: Payer: Self-pay | Admitting: *Deleted

## 2014-02-04 ENCOUNTER — Telehealth: Payer: Self-pay | Admitting: Cardiovascular Disease

## 2014-02-04 MED ORDER — ATORVASTATIN CALCIUM 20 MG PO TABS: 20.0000 mg | ORAL_TABLET | Freq: Every day | ORAL | Status: DC

## 2014-02-04 NOTE — Telephone Encounter (Signed)
Rx was sent to pharmacy electronically. Patient must keep appointment with Dr Rennis GoldenHilty on 02/06/14.

## 2014-02-04 NOTE — Telephone Encounter (Signed)
Need refill on Lipitor 20 mg #30

## 2014-02-06 ENCOUNTER — Encounter: Payer: Self-pay | Admitting: Internal Medicine

## 2014-02-06 ENCOUNTER — Ambulatory Visit (INDEPENDENT_AMBULATORY_CARE_PROVIDER_SITE_OTHER): Payer: BC Managed Care – PPO | Admitting: Internal Medicine

## 2014-02-06 VITALS — BP 142/74 | HR 63 | Ht 59.0 in | Wt 145.2 lb

## 2014-02-06 DIAGNOSIS — R0989 Other specified symptoms and signs involving the circulatory and respiratory systems: Secondary | ICD-10-CM

## 2014-02-06 DIAGNOSIS — R0600 Dyspnea, unspecified: Secondary | ICD-10-CM

## 2014-02-06 DIAGNOSIS — I10 Essential (primary) hypertension: Secondary | ICD-10-CM

## 2014-02-06 DIAGNOSIS — R0609 Other forms of dyspnea: Secondary | ICD-10-CM

## 2014-02-06 DIAGNOSIS — E119 Type 2 diabetes mellitus without complications: Secondary | ICD-10-CM

## 2014-02-06 NOTE — Patient Instructions (Addendum)
Your physician has requested that you have an echocardiogram. Echocardiography is a painless test that uses sound waves to create images of your heart. It provides your doctor with information about the size and shape of your heart and how well your heart's chambers and valves are working. This procedure takes approximately one hour. There are no restrictions for this procedure. ** please schedule echo  Dr. Rennis GoldenHIlty recommends that all of your children have echocardiograms for screening  Your physician wants you to follow-up in: 1 year. You will receive a reminder letter in the mail two months in advance. If you don't receive a letter, please call our office to schedule the follow-up appointment.

## 2014-02-11 ENCOUNTER — Encounter: Payer: Self-pay | Admitting: Internal Medicine

## 2014-02-11 DIAGNOSIS — R0600 Dyspnea, unspecified: Secondary | ICD-10-CM | POA: Insufficient documentation

## 2014-02-11 NOTE — Progress Notes (Signed)
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 Clarene DukeLittle. She had a 40 mm subvalvular gradient and has had shortness of breath; however, this is improved with beta blocker, changes in diet, and exercise. Recently, we obtained another echocardiogram to look at her valve gradient that was performed on October 05, 2012. It does show a small LV cavity with severe septal hypertrophy, but no evidence of systolic anterior motion in the mitral valve, and her ventricular septum measured 2.5 cm. The posterior wall measured 1.6 cm. EF was 65 to 70% with dynamic obstruction. The peak velocity in the outflow tract was 393 cm per second and it corresponded to a peak gradient of 62 mmHg. There is a small intracavitary gradient as well, and there is a grade 1 diastolic dysfunction with normal filling pressures. I reviewed the results today with the patient and feel that the gradient certainly has increased some; although, her symptoms are not any different than they had been previously. Heart rate still remains pretty well controlled in the 70s or 80s on a beta blocker, and we could consider up-titrating that; however, blood pressure is normal at 130/78. For now, we will plan to keep her on the 75 mg of metoprolol twice a day unless she starts to become more symptomatic and then consider increasing that to 100 mg twice a day. We did talk about options, possibly for management of her hypertrophic cardiomyopathy including if she develops worsening gradients over time. Those options include surgical myectomy versus alcohol septal ablation.  Today Ms. Emily Mathews returns and has some complaints of mild dyspnea with exertion. She denies any chest pain or presyncopal or syncopal episodes.  PMHx:    Past Medical History  Diagnosis Date  . Diabetes mellitus without complication   . Heart murmur   . Hypertension   . Hypertrophic obstructive cardiomyopathy     Past Surgical History  Procedure Laterality Date  . Cesarean section    . Abdominal hysterectomy    . Transthoracic echocardiogram  09/2012    EF 65-70%, severe septal hypertrophy, grade 1 diastolic dysfunction; LA in upper limites of normal in size  . Cardiac catheterization  12/29/2008    normal L main, LAD free of siease, Cfx free of disease, normal RCA, hypertrophic cardiomyopathy with 60mm subvalvular to apex gradient (Dr. Langston ReusingA. [facility])    FAMHx:  Family History  Problem Relation Age of Onset  . Diabetes Mother   . Heart disease Mother   . Hypertension Mother   . Stroke Mother   . Hyperlipidemia Mother   . Diabetes Sister   . Hypertension Sister   . Stroke Sister   . Hyperlipidemia Sister     SOCHx:   reports that she has never smoked. She does not have any smokeless tobacco history on file. She reports that she does not drink alcohol or use illicit drugs.  ALLERGIES:  Allergies  Allergen Reactions  . Celebrex [Celecoxib] Rash    ROS: A comprehensive review of systems was negative except for: Respiratory: positive for dyspnea on exertion  HOME MEDS: Current Outpatient Prescriptions  Medication Sig Dispense Refill  . atorvastatin (LIPITOR) 20 MG tablet Take 1 tablet (20 mg total) by mouth daily.  30 tablet  0  . diltiazem (CARDIZEM CD) 240  MG 24 hr capsule TAKE ONE CAPSULE BY MOUTH ONCE DAILY  30 capsule  1  . ezetimibe (ZETIA) 10 MG tablet Take 10 mg by mouth daily.      . insulin lispro protamine-lispro (HUMALOG 75/25 MIX) (75-25) 100 UNIT/ML SUSP injection Inject into the skin.      . Liraglutide (VICTOZA) 18 MG/3ML SOPN Inject into the skin daily.      Marland Kitchen losartan-hydrochlorothiazide (HYZAAR) 100-25 MG per tablet Take 0.5 tablets by mouth daily.      . metFORMIN (GLUCOPHAGE) 500 MG tablet Take by mouth  2 (two) times daily with a meal.      . metoprolol (LOPRESSOR) 50 MG tablet Take 1.5 tablets (75 mg total) by mouth 2 (two) times daily.  270 tablet  1  . pantoprazole (PROTONIX) 40 MG tablet Take 40 mg by mouth daily.      . simvastatin (ZOCOR) 20 MG tablet Take 20 mg by mouth daily.       No current facility-administered medications for this visit.    LABS/IMAGING: No results found for this or any previous visit (from the past 48 hour(s)). No results found.  VITALS: BP 142/74  Pulse 63  Ht 4\' 11"  (1.499 m)  Wt 145 lb 3.2 oz (65.862 kg)  BMI 29.31 kg/m2  EXAM: General appearance: alert and no distress Neck: no carotid bruit and no JVD Lungs: clear to auscultation bilaterally Heart: regular rate and rhythm, S1, S2 normal and systolic murmur: systolic ejection 3/6, harsh at 2nd right intercostal space Abdomen: soft, non-tender; bowel sounds normal; no masses,  no organomegaly Extremities: extremities normal, atraumatic, no cyanosis or edema Pulses: 2+ and symmetric Skin: Skin color, texture, turgor normal. No rashes or lesions Neurologic: Grossly normal PSych: Mood, affect norma  EKG: Normal sinus rhythm at 63, LVH with repolarization changes  ASSESSMENT: 1. Hypertrophic obstructive cardiomyopathy 2. Mild dyspnea with exertion 3. LVH with repolarization 4. Hypertension 5. Dyslipidemia 6. Diabetes type 2  PLAN: 1.   Mrs. Cochenour did have a significant LV outflow tract gradient but has generally been asymptomatic. She is having some new mild dyspnea on exertion which I do not believe is related to her gradient however I would recommend reassessment of that by echo. It has been over one year since her last echocardiogram. All being contact her with the results of that study. Otherwise her heart rate remains fairly well controlled and blood pressure is also reasonably well-controlled. Plan to continue current medications.  Chrystie Nose, MD, [facility] Attending  Cardiologist [facility]  Chrystie Nose 02/11/2014, 12:49 PM

## 2014-02-13 ENCOUNTER — Ambulatory Visit (HOSPITAL_COMMUNITY): Payer: BC Managed Care – PPO

## 2014-03-05 ENCOUNTER — Ambulatory Visit (HOSPITAL_COMMUNITY): Payer: BC Managed Care – PPO

## 2014-03-25 ENCOUNTER — Telehealth: Payer: Self-pay | Admitting: Internal Medicine

## 2014-03-25 ENCOUNTER — Other Ambulatory Visit: Payer: Self-pay | Admitting: Internal Medicine

## 2014-03-25 NOTE — Telephone Encounter (Signed)
Returned call to patient. Patient states she went to pharmacy to get refills and pharmacy told her she needed to call our office. RN informed patient that cardizem was refilled today and she is not sure what other medications she needed refilled. She will check with pharmacy to send requests.

## 2014-03-25 NOTE — Telephone Encounter (Signed)
Rx was sent to pharmacy electronically. 

## 2014-03-25 NOTE — Telephone Encounter (Signed)
Pt said she went to the pharmacy today and she was told she needed to call this office concerning her medicine.

## 2014-05-01 ENCOUNTER — Ambulatory Visit: Payer: Self-pay

## 2014-05-01 ENCOUNTER — Ambulatory Visit (INDEPENDENT_AMBULATORY_CARE_PROVIDER_SITE_OTHER): Payer: BC Managed Care – PPO | Admitting: Emergency Medicine

## 2014-05-01 VITALS — BP 142/70 | HR 81 | Temp 98.0°F | Resp 18 | Ht 59.0 in | Wt 143.0 lb

## 2014-05-01 DIAGNOSIS — M25519 Pain in unspecified shoulder: Secondary | ICD-10-CM

## 2014-05-01 NOTE — Progress Notes (Deleted)
Subjective:   This chart was scribed for Viviann Spare A. Cleta Alberts, MD by Arlan Organ, [facility] Scribe. This patient was seen in room 13 and the patient's care was started 3:42 PM.     Patient ID: Emily Mathews, female    DOB: 01/20/1946, 68 y.o.   MRN: [id number]  Chief Complaint  Patient presents with   Shoulder Injury    left- 1 week ago   Shoulder Pain    HPI  HPI Comments: Emily Mathews is a 68 y.o. female with a PMHx HTN and DM who presents to [facility] complaining of constant, moderate L sided shoulder pain x 1 week that is unchanged at this time. Describes pain as a "pull". Pt states she was lifting a pt while at work onto a bed when she noted a "tingle" in her shoulder. Pain is exacerbated with certain movements. No alleviating factors at this time. She has tried OTC Ibuprofen and heat application without any improvement for symptoms. At this time she denies any fever or chills. No weakness, loss of sensation, numbness, or paresthesia. 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 No other concerns this visit.   Patient Active Problem List   Diagnosis Date Noted   Dyspnea 02/11/2014   HTN (hypertension) 10/11/2013   DM (diabetes mellitus) 10/11/2013   Past Medical History  Diagnosis Date   Diabetes mellitus without complication    Heart murmur    Hypertension    Hypertrophic obstructive cardiomyopathy    Past Surgical History  Procedure Laterality Date   Cesarean section     Abdominal hysterectomy     Transthoracic echocardiogram  09/2012    EF 65-70%, severe septal hypertrophy, grade 1 diastolic dysfunction; LA in upper limites of normal in size   Cardiac catheterization  12/29/2008    normal L main, LAD free of siease, Cfx free of disease, normal RCA, hypertrophic cardiomyopathy with 60mm subvalvular to apex gradient (Dr. Mervyn Skeeters. [facility])    Allergies  Allergen Reactions   Celebrex [Celecoxib] Rash   Prior to Admission medications   Medication Sig Start Date End Date Taking? Authorizing Provider  atorvastatin (LIPITOR) 20 MG tablet Take 1 tablet (20 mg total) by mouth daily. 02/04/14  Yes Chrystie Nose, MD  diltiazem (CARDIZEM CD) 240 MG 24 hr capsule TAKE ONE CAPSULE BY MOUTH ONCE DAILY 03/25/14  Yes Chrystie Nose, MD  ezetimibe (ZETIA) 10 MG tablet Take 10 mg by mouth daily.   Yes Historical Provider, MD  insulin lispro protamine-lispro (HUMALOG 75/25 MIX) (75-25) 100 UNIT/ML SUSP injection Inject into the skin.   Yes Historical Provider, MD  Liraglutide (VICTOZA) 18 MG/3ML SOPN Inject into the skin daily.   Yes Historical Provider, MD  losartan-hydrochlorothiazide (HYZAAR) 100-25 MG per tablet Take 0.5 tablets by mouth daily. 01/03/14  Yes Chrystie Nose, MD  metFORMIN (GLUCOPHAGE) 500 MG tablet Take by mouth 2 (two) times daily with a meal.   Yes Historical Provider, MD  metoprolol (LOPRESSOR) 50 MG tablet Take 1.5 tablets (75 mg total) by mouth 2 (two) times daily. 01/03/14  Yes Chrystie Nose, MD  pantoprazole (PROTONIX) 40 MG tablet Take 40 mg by mouth daily.   Yes Historical Provider, MD  simvastatin (ZOCOR) 20 MG tablet Take 20 mg by mouth daily.   Yes Historical Provider, MD    Review of Systems  Constitutional: Negative for  fever and chills.  Musculoskeletal: Positive for arthralgias (L arm).  Neurological: Negative for weakness and numbness.  Psychiatric/Behavioral: Negative for confusion.    Triage Vitals: BP 142/70   Pulse 81   Temp(Src) 98 F (36.7 C) (Oral)   Resp 18   Ht 4\' 11"  (1.499 m)   Wt 143 lb (64.864 kg)   BMI 28.87 kg/m2   SpO2 99%   Objective:   Physical Exam  Nursing note and vitals reviewed. Constitutional: She is oriented to person, place, and time. She appears well-developed and well-nourished.  HENT:  Head: Normocephalic.  Eyes: EOM are normal.  Neck: Normal range of motion.  No  neck tenderness with good ROM of neck  Pulmonary/Chest: Effort normal.  Abdominal: She exhibits no distension.  Musculoskeletal: Normal range of motion. She exhibits tenderness.  Pain with abduction of the L arm Weak external rotation of the L arm  Neurological: She is alert and oriented to person, place, and time.  Neurovascularly intact  Psychiatric: She has a normal mood and affect.    Assessment & Plan:  Patient could not stay. She left. I advised her I would be happy to see her on Saturday. I refund her co-pay for today. She will take ibuprofen for pain I personally performed the services described in this documentation, which was scribed in my presence. The recorded information has been reviewed and is accurate.

## 2014-05-01 NOTE — Addendum Note (Signed)
Addended by: Lesle ChrisAUB, STEVEN A on: 05/01/2014 04:29 PM   Modules accepted: Level of Service

## 2014-05-01 NOTE — Progress Notes (Signed)
Subjective:   This chart was scribed for Viviann Spare A. Cleta Alberts, MD by Arlan Organ, [facility] Scribe. This patient was seen in room 13 and the patient's care was started 3:42 PM.     Patient ID: Emily Mathews, female    DOB: 06-06-1946, 68 y.o.   MRN: [id number]  Chief Complaint  Patient presents with  . Shoulder Injury    left- 1 week ago  . Shoulder Pain    Shoulder Injury  Pertinent negatives include no numbness.  Shoulder Pain  Pertinent negatives include no fever or numbness.    HPI Comments: Emily Mathews is a 68 y.o. female with a PMHx HTN and DM who presents to [facility] complaining of constant, moderate L sided shoulder pain x 1 week that is unchanged at this time. Describes pain as a "pull". Pt states she was lifting a pt while at work onto a bed when she noted a "tingle" in her shoulder. Pain is exacerbated with certain movements. No alleviating factors at this time. She has tried OTC Ibuprofen and heat application without any improvement for symptoms. At this time she denies any fever or chills. No weakness, loss of sensation, numbness, or paresthesia. 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 No other concerns this visit.   Patient Active Problem List   Diagnosis Date Noted  . Dyspnea 02/11/2014  . HTN (hypertension) 10/11/2013  . DM (diabetes mellitus) 10/11/2013   Past Medical History  Diagnosis Date  . Diabetes mellitus without complication   . Heart murmur   . Hypertension   . Hypertrophic obstructive cardiomyopathy    Past Surgical History  Procedure Laterality Date  . Cesarean section    . Abdominal hysterectomy    . Transthoracic echocardiogram  09/2012    EF 65-70%, severe septal hypertrophy, grade 1 diastolic dysfunction; LA in upper limites of normal in size  . Cardiac catheterization  12/29/2008    normal L main, LAD free of  siease, Cfx free of disease, normal RCA, hypertrophic cardiomyopathy with 60mm subvalvular to apex gradient (Dr. Mervyn Skeeters. [facility])   Allergies  Allergen Reactions  . Celebrex [Celecoxib] Rash   Prior to Admission medications   Medication Sig Start Date End Date Taking? Authorizing Provider  atorvastatin (LIPITOR) 20 MG tablet Take 1 tablet (20 mg total) by mouth daily. 02/04/14  Yes Chrystie Nose, MD  diltiazem (CARDIZEM CD) 240 MG 24 hr capsule TAKE ONE CAPSULE BY MOUTH ONCE DAILY 03/25/14  Yes Chrystie Nose, MD  ezetimibe (ZETIA) 10 MG tablet Take 10 mg by mouth daily.   Yes Historical Provider, MD  insulin lispro protamine-lispro (HUMALOG 75/25 MIX) (75-25) 100 UNIT/ML SUSP injection Inject into the skin.   Yes Historical Provider, MD  Liraglutide (VICTOZA) 18 MG/3ML SOPN Inject into the skin daily.   Yes Historical Provider, MD  losartan-hydrochlorothiazide (HYZAAR) 100-25 MG per tablet Take 0.5 tablets by mouth daily. 01/03/14  Yes Chrystie Nose, MD  metFORMIN (GLUCOPHAGE) 500 MG tablet Take by mouth 2 (two) times daily with a meal.   Yes Historical Provider, MD  metoprolol (LOPRESSOR) 50 MG tablet Take 1.5 tablets (75 mg total) by mouth 2 (two) times daily. 01/03/14  Yes Chrystie Nose, MD  pantoprazole (PROTONIX) 40 MG tablet Take 40 mg by mouth daily.   Yes Historical Provider, MD  simvastatin (ZOCOR) 20 MG tablet Take 20  mg by mouth daily.   Yes Historical Provider, MD    Review of Systems  Constitutional: Negative for fever and chills.  Musculoskeletal: Positive for arthralgias (L arm).  Neurological: Negative for weakness and numbness.  Psychiatric/Behavioral: Negative for confusion.    Triage Vitals: BP 142/70  Pulse 81  Temp(Src) 98 F (36.7 C) (Oral)  Resp 18  Ht 4\' 11"  (1.499 m)  Wt 143 lb (64.864 kg)  BMI 28.87 kg/m2  SpO2 99%   Objective:   Physical Exam  Nursing note and vitals reviewed. Constitutional: She is oriented to person, place, and time. She appears  well-developed and well-nourished.  HENT:  Head: Normocephalic.  Eyes: EOM are normal.  Neck: Normal range of motion.  No neck tenderness with good ROM of neck  Pulmonary/Chest: Effort normal.  Abdominal: She exhibits no distension.  Musculoskeletal: Normal range of motion. She exhibits tenderness.  Pain with abduction of the L arm Weak external rotation of the L arm  Neurological: She is alert and oriented to person, place, and time.  Neurovascularly intact  Psychiatric: She has a normal mood and affect.    Assessment & Plan:  Patient could not stay. She left. I advised her I would be happy to see her on Saturday. I refund her co-pay for today. She will take ibuprofen for pain I personally performed the services described in this documentation, which was scribed in my presence. The recorded information has been reviewed and is accurate.

## 2014-05-22 ENCOUNTER — Telehealth: Payer: Self-pay | Admitting: *Deleted

## 2014-05-22 NOTE — Telephone Encounter (Signed)
PATIENT WALKED FILLED A WALK IN FORMED OUT RN called and spoke to patient. Patient states she was suppose to have an echo earlier in the year , but has not had it done ,because she need to raise the money up  She states she had an echo last year but insurance did not cover it and she had to pay $1000 dollars out of pocket.  She would like a letter indicating why she needs echo and how much it will cost . She is trying to  borrow/use her 401-K  MONEY.  Will defer  Dr Dorene Grebe RN

## 2014-05-23 NOTE — Telephone Encounter (Signed)
The echo was to reassess her hypertrophic cardiomyopathy, since she was more short of breath. It is not emergent, so it does not make sense for her to borrow her 401K money to get an echo. We can check it at a later time.  Dr. Rennis Golden

## 2014-05-23 NOTE — Telephone Encounter (Signed)
SPOKE TO PATIENT  INFORMED HER THAT ECHO,IS NOT NEEDED  AT THE PRESENT TIME. SHE VERBALIZED UNDERSTANDING.

## 2014-05-23 NOTE — Telephone Encounter (Signed)
Left message to call back  

## 2014-06-24 ENCOUNTER — Other Ambulatory Visit: Payer: Self-pay | Admitting: Internal Medicine

## 2014-06-24 NOTE — Telephone Encounter (Signed)
Rx was sent to pharmacy electronically. 

## 2015-07-14 ENCOUNTER — Ambulatory Visit (INDEPENDENT_AMBULATORY_CARE_PROVIDER_SITE_OTHER): Payer: BLUE CROSS/BLUE SHIELD | Admitting: Family Medicine

## 2015-07-14 ENCOUNTER — Ambulatory Visit (INDEPENDENT_AMBULATORY_CARE_PROVIDER_SITE_OTHER): Payer: BLUE CROSS/BLUE SHIELD

## 2015-07-14 VITALS — BP 150/72 | HR 93 | Temp 99.2°F | Resp 20 | Ht 60.0 in | Wt 145.6 lb

## 2015-07-14 DIAGNOSIS — G8929 Other chronic pain: Secondary | ICD-10-CM

## 2015-07-14 DIAGNOSIS — K5900 Constipation, unspecified: Secondary | ICD-10-CM

## 2015-07-14 DIAGNOSIS — R1030 Lower abdominal pain, unspecified: Secondary | ICD-10-CM

## 2015-07-14 DIAGNOSIS — R1031 Right lower quadrant pain: Secondary | ICD-10-CM

## 2015-07-14 LAB — POCT CBC
GRANULOCYTE PERCENT: 70.1 % (ref 37–80)
HEMATOCRIT: 37.1 % — AB (ref 37.7–47.9)
HEMOGLOBIN: 11.4 g/dL — AB (ref 12.2–16.2)
Lymph, poc: 2.3 (ref 0.6–3.4)
MCH: 22.6 pg — AB (ref 27–31.2)
MCHC: 30.7 g/dL — AB (ref 31.8–35.4)
MCV: 73.7 fL — AB (ref 80–97)
MID (cbc): 0.6 (ref 0–0.9)
MPV: 8.1 fL (ref 0–99.8)
POC GRANULOCYTE: 6.9 (ref 2–6.9)
POC LYMPH PERCENT: 23.9 %L (ref 10–50)
POC MID %: 6 % (ref 0–12)
Platelet Count, POC: 198 10*3/uL (ref 142–424)
RBC: 5.03 M/uL (ref 4.04–5.48)
RDW, POC: 16.3 %
WBC: 9.8 10*3/uL (ref 4.6–10.2)

## 2015-07-14 LAB — POC MICROSCOPIC URINALYSIS (UMFC): Mucus: ABSENT

## 2015-07-14 LAB — POCT URINALYSIS DIP (MANUAL ENTRY)
BILIRUBIN UA: NEGATIVE
Bilirubin, UA: NEGATIVE
Blood, UA: NEGATIVE
Glucose, UA: NEGATIVE
LEUKOCYTES UA: NEGATIVE
NITRITE UA: NEGATIVE
PH UA: 5.5
PROTEIN UA: NEGATIVE
Spec Grav, UA: 1.025
UROBILINOGEN UA: 0.2

## 2015-07-14 MED ORDER — POLYETHYLENE GLYCOL 3350 17 G PO PACK: 17.0000 g | PACK | Freq: Two times a day (BID) | ORAL | Status: DC

## 2015-07-14 NOTE — Patient Instructions (Signed)
Constipation, Adult °Constipation is when a person has fewer than three bowel movements a week, has difficulty having a bowel movement, or has stools that are dry, hard, or larger than normal. As people grow older, constipation is more common. A low-fiber diet, not taking in enough fluids, and taking certain medicines may make constipation worse.  °CAUSES  °· Certain medicines, such as antidepressants, pain medicine, iron supplements, antacids, and water pills.   °· Certain diseases, such as diabetes, irritable bowel syndrome (IBS), thyroid disease, or depression.   °· Not drinking enough water.   °· Not eating enough fiber-rich foods.   °· Stress or travel.   °· Lack of physical activity or exercise.   °· Ignoring the urge to have a bowel movement.   °· Using laxatives too much.   °SIGNS AND SYMPTOMS  °· Having fewer than three bowel movements a week.   °· Straining to have a bowel movement.   °· Having stools that are hard, dry, or larger than normal.   °· Feeling full or bloated.   °· Pain in the lower abdomen.   °· Not feeling relief after having a bowel movement.   °DIAGNOSIS  °Your health care provider will take a medical history and perform a physical exam. Further testing may be done for severe constipation. Some tests may include: °· A barium enema X-ray to examine your rectum, colon, and, sometimes, your small intestine.   °· A sigmoidoscopy to examine your lower colon.   °· A colonoscopy to examine your entire colon. °TREATMENT  °Treatment will depend on the severity of your constipation and what is causing it. Some dietary treatments include drinking more fluids and eating more fiber-rich foods. Lifestyle treatments may include regular exercise. If these diet and lifestyle recommendations do not help, your health care provider may recommend taking over-the-counter laxative medicines to help you have bowel movements. Prescription medicines may be prescribed if over-the-counter medicines do not work.    °HOME CARE INSTRUCTIONS  °· Eat foods that have a lot of fiber, such as fruits, vegetables, whole grains, and beans. °· Limit foods high in fat and processed sugars, such as french fries, hamburgers, cookies, candies, and soda.   °· A fiber supplement may be added to your diet if you cannot get enough fiber from foods.   °· Drink enough fluids to keep your urine clear or pale yellow.   °· Exercise regularly or as directed by your health care provider.   °· Go to the restroom when you have the urge to go. Do not hold it.   °· Only take over-the-counter or prescription medicines as directed by your health care provider. Do not take other medicines for constipation without talking to your health care provider first.   °SEEK IMMEDIATE MEDICAL CARE IF:  °· You have bright red blood in your stool.   °· Your constipation lasts for more than 4 days or gets worse.   °· You have abdominal or rectal pain.   °· You have thin, pencil-like stools.   °· You have unexplained weight loss. °MAKE SURE YOU:  °· Understand these instructions. °· Will watch your condition. °· Will get help right away if you are not doing well or get worse. °  °This information is not intended to replace advice given to you by your health care provider. Make sure you discuss any questions you have with your health care provider. °  °Document Released: 06/03/2004 Document Revised: 09/26/2014 Document Reviewed: 06/17/2013 °Elsevier Interactive Patient Education ©2016 Elsevier Inc. ° °Polyethylene Glycol powder °What is this medicine? °POLYETHYLENE GLYCOL 3350 (pol ee ETH i leen; GLYE col) powder is   a laxative used to treat constipation. It increases the amount of water in the stool. Bowel movements become easier and more frequent. °This medicine may be used for other purposes; ask your health care provider or pharmacist if you have questions. °What should I tell my health care provider before I take this medicine? °They need to know if you have any of  these conditions: °-a history of blockage of the stomach or intestine °-current abdomen distension or pain °-difficulty swallowing °-diverticulitis, ulcerative colitis, or other chronic bowel disease °-phenylketonuria °-an unusual or allergic reaction to polyethylene glycol, other medicines, dyes, or preservatives °-pregnant or trying to get pregnant °-breast-feeding °How should I use this medicine? °Take this medicine by mouth. The bottle has a measuring cap that is marked with a line. Pour the powder into the cap up to the marked line (the dose is about 1 heaping tablespoon). Add the powder in the cap to a full glass (4 to 8 ounces or 120 to 240 ml) of water, juice, soda, coffee or tea. Mix the powder well. Drink the solution. Take exactly as directed. Do not take your medicine more often than directed. °Talk to your pediatrician regarding the use of this medicine in children. Special care may be needed. °Overdosage: If you think you have taken too much of this medicine contact a poison control center or emergency room at once. °NOTE: This medicine is only for you. Do not share this medicine with others. °What if I miss a dose? °If you miss a dose, take it as soon as you can. If it is almost time for your next dose, take only that dose. Do not take double or extra doses. °What may interact with this medicine? °Interactions are not expected. °This list may not describe all possible interactions. Give your health care provider a list of all the medicines, herbs, non-prescription drugs, or dietary supplements you use. Also tell them if you smoke, drink alcohol, or use illegal drugs. Some items may interact with your medicine. °What should I watch for while using this medicine? °Do not use for more than 2 weeks without advice from your doctor or health care professional. It can take 2 to 4 days to have a bowel movement and to experience improvement in constipation. See your health care professional for any changes in  bowel habits, including constipation, that are severe or last longer than three weeks. °Always take this medicine with plenty of water. °What side effects may I notice from receiving this medicine? °Side effects that you should report to your doctor or health care professional as soon as possible: °-diarrhea °-difficulty breathing °-itching of the skin, hives, or skin rash °-severe bloating, pain, or distension of the stomach °-vomiting °Side effects that usually do not require medical attention (report to your doctor or health care professional if they continue or are bothersome): °-bloating or gas °-lower abdominal discomfort or cramps °-nausea °This list may not describe all possible side effects. Call your doctor for medical advice about side effects. You may report side effects to FDA at 1-800-FDA-1088. °Where should I keep my medicine? °Keep out of the reach of children. °Store between 15 and 30 degrees C (59 and 86 degrees F). Throw away any unused medicine after the expiration date. °NOTE: This sheet is a summary. It may not cover all possible information. If you have questions about this medicine, talk to your doctor, pharmacist, or health care provider. °  °© 2016, Elsevier/Gold Standard. (2008-04-07 16:50:45) ° °

## 2015-07-14 NOTE — Progress Notes (Signed)
[facility] [address], [place] [address] [phone]  Date:  07/14/2015   Name:  Emily BameJosephine Mathews   DOB:  [birth year]   MRN:  [id number]  PCP:  Sid FalconKALISH, MICHAEL J, MD    Chief Complaint: Abdominal Pain and Constipation   History of Present Illness:  Emily BameJosephine Levengood is a [age] y.o. very pleasant female with diabetes, HTN, HCMpatient who presents with the following: 36 hour history of lower pelvic pain, b/l as well as posterior rectal pressure:  - After initially feeling constipated, she took a half a cup of milk of magnesia. She had several small soft BMs which changed the quality of the pain to more of a stabbing. Not watery.  - Today it turned mucousy. Countinued to have little BMs all day.   - Worked last night, but in significant pain.   - Currently has a quite a bit of rectal pressure. - Usually has daily, 1-2 BMs a day.   - No recent antibiotics.   - No new medications or pain medications.  - Today she had a BM with blood on it.   - No history of significant constipation - BM painful with a small amount of BRB - Fibroid surgery remotely as well as a c-section - Taking low dose ASA, no other analgesics with current infection.  - Colonoscopy ~ 5years ago was unremarkable. Does not believe she had diverticulosis at that time.  She was suppose to have her next one within the last few months, but has yet to schedule the appointment.  - No sick contacts, but works as a LawyerCNA at a [employer]   Patient Active Problem List   Diagnosis Date Noted  . Dyspnea 02/11/2014  . HTN (hypertension) 10/11/2013  . DM (diabetes mellitus) (HCC) 10/11/2013    Past Medical History  Diagnosis Date  . Diabetes mellitus without complication (HCC)   . Heart murmur   . Hypertension   . Hypertrophic obstructive cardiomyopathy [facility](HCC)     Past Surgical History  Procedure Laterality Date  . Cesarean section    . Abdominal hysterectomy    . Transthoracic echocardiogram   09/2012    EF 65-70%, severe septal hypertrophy, grade 1 diastolic dysfunction; LA in upper limites of normal in size  . Cardiac catheterization  12/29/2008    normal L main, LAD free of siease, Cfx free of disease, normal RCA, hypertrophic cardiomyopathy with 60mm subvalvular to apex gradient (Dr. Langston ReusingA. Little)    Social History  Substance Use Topics  . Smoking status: Never Smoker   . Smokeless tobacco: None  . Alcohol Use: No    Family History  Problem Relation Age of Onset  . Diabetes Mother   . Heart disease Mother   . Hypertension Mother   . Stroke Mother   . Hyperlipidemia Mother   . Diabetes Sister   . Hypertension Sister   . Stroke Sister   . Hyperlipidemia Sister     Allergies  Allergen Reactions  . Penicillins   . Celebrex [Celecoxib] Rash    Medication list has been reviewed and updated.  Current Outpatient Prescriptions on File Prior to Visit  Medication Sig Dispense Refill  . atorvastatin (LIPITOR) 20 MG tablet Take 1 tablet (20 mg total) by mouth daily. 30 tablet 0  . diltiazem (CARDIZEM CD) 240 MG 24 hr capsule TAKE ONE CAPSULE BY MOUTH ONCE DAILY 30 capsule 9  . ezetimibe (ZETIA) 10 MG tablet Take 10 mg by mouth daily.    .Marland Kitchen  insulin lispro protamine-lispro (HUMALOG 75/25 MIX) (75-25) 100 UNIT/ML SUSP injection Inject into the skin.    . Liraglutide (VICTOZA) 18 MG/3ML SOPN Inject into the skin daily.    Marland Kitchen losartan-hydrochlorothiazide (HYZAAR) 100-25 MG per tablet Take 0.5 tablets by mouth daily. 15 tablet 8  . metFORMIN (GLUCOPHAGE) 500 MG tablet Take by mouth 2 (two) times daily with a meal.    . metoprolol (LOPRESSOR) 50 MG tablet Take 1.5 tablets (75 mg total) by mouth 2 (two) times daily. 270 tablet 1  . pantoprazole (PROTONIX) 40 MG tablet Take 40 mg by mouth daily.    . simvastatin (ZOCOR) 20 MG tablet Take 20 mg by mouth daily.     No current facility-administered medications on file prior to visit.    Review of Systems:  Review of Systems   Constitutional: Negative for fever, chills and malaise/fatigue.  HENT: Negative for congestion.   Eyes: Negative for blurred vision and double vision.  Respiratory: Negative for cough and shortness of breath.   Cardiovascular: Negative for chest pain and palpitations.  Gastrointestinal: Positive for nausea (minimal), abdominal pain, constipation (initially) and blood in stool. Negative for vomiting and diarrhea.  Genitourinary: Negative for dysuria, urgency and frequency.  Musculoskeletal: Positive for joint pain (not worsened currently). Negative for myalgias.  Skin: Negative for rash.  Neurological: Positive for weakness and headaches. Negative for dizziness.  Endo/Heme/Allergies: Does not bruise/bleed easily.    Physical Examination: Filed Vitals:   07/14/15 1937  BP: 150/72  Pulse: 93  Temp: 99.2 F (37.3 C)  Resp: 20   Filed Vitals:   07/14/15 1937  Height: 5' (1.524 m)  Weight: 145 lb 9.6 oz (66.044 kg)   Body mass index is 28.44 kg/(m^2). Ideal Body Weight: Weight in (lb) to have BMI = 25: 127.7  GEN: WDWN, NAD, Non-toxic, A & O x 3 HEENT: Atraumatic, Normocephalic.  Ears and Nose: No external deformity. CV: RRR, Holosystolic murmur.  No JVD. No thrill. No extra heart sounds. PULM: CTAB, no wheezes, crackles, rhonchi. No retractions. No resp. distress. No accessory muscle use. ABD: Mildly TTP in lower quadrants. S, ND, +BS, although less than normal. No rebound. No HSM. EXTR: No c/c/e NEURO Normal gait.  PSYCH: Normally interactive. Conversant. Not depressed or anxious appearing.  Calm demeanor.   [facility] reading (PRIMARY) by  Dr. Mayford Knife and Dr. Neva Seat shows non-specific bowel gas. No other abnormality identified.    Assessment and Plan: Lower Abdominal pain: Likely secondary to constipation.  White count is 9.8 and may be on it's way up. If diverticulitis, then very early in course. No known history of diverticulosis. Blood in stool, minimal when examined in  toilet.  Urine is unremarkable. We will treat her constipation and follow her closely over the next few days.  - Minimal BRB per rectum: close follow up. Has received notice for colonoscopy in the last few months and will make an appointment asap.   - Miralax bid until regular BMs.   - Advised close f/u if not improving in the next day or to.  Certainly not ill appearing today and abdominal exam is reassuring.   - Given precautions to return to clinic or ED.  - f/u PRN as above.    Signed Guinevere Scarlet, MD

## 2015-07-15 NOTE — Progress Notes (Signed)
Xray read and patient discussed/examined with Dr. Mayford KnifeWilliams. Agree with assessment and plan of care per his note, including close follow up in 24-48hrs.

## 2015-12-17 ENCOUNTER — Ambulatory Visit (INDEPENDENT_AMBULATORY_CARE_PROVIDER_SITE_OTHER): Payer: BLUE CROSS/BLUE SHIELD | Admitting: Physician Assistant

## 2015-12-17 ENCOUNTER — Other Ambulatory Visit: Payer: Self-pay

## 2015-12-17 VITALS — BP 132/60 | HR 90 | Temp 98.1°F | Resp 17 | Ht 60.0 in | Wt 142.0 lb

## 2015-12-17 DIAGNOSIS — N3000 Acute cystitis without hematuria: Secondary | ICD-10-CM | POA: Diagnosis not present

## 2015-12-17 DIAGNOSIS — R5383 Other fatigue: Secondary | ICD-10-CM | POA: Diagnosis not present

## 2015-12-17 DIAGNOSIS — R5381 Other malaise: Secondary | ICD-10-CM

## 2015-12-17 LAB — POC MICROSCOPIC URINALYSIS (UMFC): MUCUS RE: ABSENT

## 2015-12-17 LAB — POCT URINALYSIS DIP (MANUAL ENTRY)
BILIRUBIN UA: NEGATIVE
Blood, UA: NEGATIVE
Glucose, UA: 100 — AB
Ketones, POC UA: NEGATIVE
Nitrite, UA: POSITIVE — AB
PH UA: 5
Protein Ur, POC: NEGATIVE
SPEC GRAV UA: 1.015
Urobilinogen, UA: 0.2

## 2015-12-17 LAB — POCT CBC
GRANULOCYTE PERCENT: 48.6 % (ref 37–80)
HEMATOCRIT: 34.6 % — AB (ref 37.7–47.9)
HEMOGLOBIN: 11.4 g/dL — AB (ref 12.2–16.2)
Lymph, poc: 1.8 (ref 0.6–3.4)
MCH: 23.7 pg — AB (ref 27–31.2)
MCHC: 33 g/dL (ref 31.8–35.4)
MCV: 71.7 fL — AB (ref 80–97)
MID (cbc): 0.5 (ref 0–0.9)
MPV: 7.9 fL (ref 0–99.8)
POC GRANULOCYTE: 2.1 (ref 2–6.9)
POC LYMPH PERCENT: 41 %L (ref 10–50)
POC MID %: 10.4 % (ref 0–12)
Platelet Count, POC: 138 10*3/uL — AB (ref 142–424)
RBC: 4.83 M/uL (ref 4.04–5.48)
RDW, POC: 15.3 %
WBC: 4.4 10*3/uL — AB (ref 4.6–10.2)

## 2015-12-17 LAB — TSH: TSH: 0.36 mIU/L — ABNORMAL LOW

## 2015-12-17 LAB — POCT GLYCOSYLATED HEMOGLOBIN (HGB A1C): Hemoglobin A1C: 7.4

## 2015-12-17 MED ORDER — CIPROFLOXACIN HCL 250 MG PO TABS: 250.0000 mg | ORAL_TABLET | Freq: Two times a day (BID) | ORAL | Status: DC

## 2015-12-17 NOTE — Telephone Encounter (Signed)
Patient was originally seen by Deliah BostonMichael Clark earlier today. Cipro was called into the wrong pharmacy during visit. Cipro was called into [facility] on [address] for patient.

## 2015-12-17 NOTE — Patient Instructions (Signed)
     IF you received an x-ray today, you will receive an invoice from [facility]. Please contact [facility] at [phone] with questions or concerns regarding your invoice.   IF you received labwork today, you will receive an invoice from [facility]. Please contact [facility] at [phone] with questions or concerns regarding your invoice.   Our billing staff will not be able to assist you with questions regarding bills from these companies.  You will be contacted with the lab results as soon as they are available. The fastest way to get your results is to activate your My Chart account. Instructions are located on the last page of this paperwork. If you have not heard from us regarding the results in 2 weeks, please contact this office.      

## 2015-12-17 NOTE — Progress Notes (Signed)
12/17/2015 1:38 PM   DOB: [birth year] / MRN: [id number]  SUBJECTIVE:  Emily Mathews is a 70 y.o. female presenting for generalized malaise and fatigue that started 1.5 weeks ago. She associates a loss of weight and decreased appetite.  She has an extensive medical history including HCOM, diabetes, HTN, and dyslipidemia.  She was here a few months ago for constipation which she say has resolved.  [facility] is [facility] and her PCP is there as well.   She is allergic to penicillins and celebrex.   She  has a past medical history of Diabetes mellitus without complication (HCC); Heart murmur; Hypertension; and Hypertrophic obstructive cardiomyopathy (HCC).    She  reports that she has never smoked. She does not have any smokeless tobacco history on file. She reports that she does not drink alcohol or use illicit drugs. She  has no sexual activity history on file. The patient  has past surgical history that includes Cesarean section; Abdominal hysterectomy; transthoracic echocardiogram (09/2012); and Cardiac catheterization (12/29/2008).  Her family history includes Diabetes in her mother and sister; Heart disease in her mother; Hyperlipidemia in her mother and sister; Hypertension in her mother and sister; Stroke in her mother and sister.  Review of Systems  Constitutional: Positive for chills and malaise/fatigue. Negative for diaphoresis.  Respiratory: Negative for cough and shortness of breath.   Cardiovascular: Negative for chest pain and leg swelling.  Gastrointestinal: Negative for nausea and diarrhea.  Genitourinary: Negative for dysuria, urgency, frequency, hematuria and flank pain.  Musculoskeletal: Negative for myalgias.  Skin: Negative for rash.  Neurological: Negative for dizziness, weakness and headaches.    Problem list and medications reviewed and updated by myself where necessary, and exist elsewhere in the encounter.   OBJECTIVE:  BP 132/60 mmHg  Pulse 90   Temp(Src) 98.1 F (36.7 C) (Oral)  Resp 17  Ht 5' (1.524 m)  Wt 142 lb (64.411 kg)  BMI 27.73 kg/m2  SpO2 97%  Physical Exam  Constitutional: She is oriented to person, place, and time. Vital signs are normal. She appears well-developed and well-nourished. She is active. She does not appear ill.  Cardiovascular: Normal rate.   Murmur (systolic murmur improved with handgriping) heard. Pulmonary/Chest: Effort normal and breath sounds normal.  Abdominal: Soft. Bowel sounds are normal.  Neurological: She is alert and oriented to person, place, and time.  Skin: Skin is warm and dry.    Results for orders placed or performed in visit on 12/17/15 (from the past 72 hour(s))  POCT CBC     Status: Abnormal   Collection Time: 12/17/15  1:04 PM  Result Value Ref Range   WBC 4.4 (A) 4.6 - 10.2 K/uL   Lymph, poc 1.8 0.6 - 3.4   POC LYMPH PERCENT 41.0 10 - 50 %L   MID (cbc) 0.5 0 - 0.9   POC MID % 10.4 0 - 12 %M   POC Granulocyte 2.1 2 - 6.9   Granulocyte percent 48.6 37 - 80 %G   RBC 4.83 4.04 - 5.48 M/uL   Hemoglobin 11.4 (A) 12.2 - 16.2 g/dL   HCT, POC 78.234.6 (A) 95.637.7 - 47.9 %   MCV 71.7 (A) 80 - 97 fL   MCH, POC 23.7 (A) 27 - 31.2 pg   MCHC 33.0 31.8 - 35.4 g/dL   RDW, POC 21.315.3 %   Platelet Count, POC 138 (A) 142 - 424 K/uL   MPV 7.9 0 - 99.8 fL  POCT urinalysis dipstick  Status: Abnormal   Collection Time: 12/17/15  1:05 PM  Result Value Ref Range   Color, UA yellow yellow   Clarity, UA cloudy (A) clear   Glucose, UA =100 (A) negative   Bilirubin, UA negative negative   Ketones, POC UA negative negative   Spec Grav, UA 1.015    Blood, UA negative negative   pH, UA 5.0    Protein Ur, POC negative negative   Urobilinogen, UA 0.2    Nitrite, UA Positive (A) Negative   Leukocytes, UA small (1+) (A) Negative  POCT glycosylated hemoglobin (Hb A1C)     Status: None   Collection Time: 12/17/15  1:07 PM  Result Value Ref Range   Hemoglobin A1C 7.4   POCT Microscopic  Urinalysis ([facility])     Status: Abnormal   Collection Time: 12/17/15  1:34 PM  Result Value Ref Range   WBC,UR,HPF,POC Moderate (A) None WBC/hpf   RBC,UR,HPF,POC None None RBC/hpf   Bacteria Many (A) None, Too numerous to count   Mucus Absent Absent   Epithelial Cells, UR Per Microscopy Few (A) None, Too numerous to count cells/hpf   Lab Results  Component Value Date   CREATININE 1.41* 12/30/2008     No results found.  ASSESSMENT AND PLAN  Chynna was seen today for chills, fatigue, extremity weakness and loss of appetite.  Diagnoses and all orders for this visit:  Malaise and fatigue: Most likely secondary to problem two.  WIll correct this issue with Abx therapy.  She is to call or return to clinic in two to three days if her symptoms are not improving.  -     POCT CBC -     POCT urinalysis dipstick -     TSH -     POCT glycosylated hemoglobin (Hb A1C)  Acute cystitis without hematuria -     POCT Microscopic Urinalysis ([facility]) -     Urine culture -     ciprofloxacin (CIPRO) 250 MG tablet; Take 1 tablet (250 mg total) by mouth 2 (two) times daily.    The patient was advised to call or return to clinic if she does not see an improvement in symptoms or to seek the care of the closest emergency department if she worsens with the above plan.   Deliah Boston, MHS, PA-C [facility] 12/17/2015 1:38 PM

## 2015-12-21 LAB — URINE CULTURE: Colony Count: >=100000

## 2016-07-25 ENCOUNTER — Ambulatory Visit (INDEPENDENT_AMBULATORY_CARE_PROVIDER_SITE_OTHER): Payer: BLUE CROSS/BLUE SHIELD

## 2016-07-25 ENCOUNTER — Ambulatory Visit (INDEPENDENT_AMBULATORY_CARE_PROVIDER_SITE_OTHER): Payer: BLUE CROSS/BLUE SHIELD | Admitting: Physician Assistant

## 2016-07-25 VITALS — BP 130/80 | HR 82 | Temp 98.5°F | Resp 17 | Ht 60.0 in | Wt 142.0 lb

## 2016-07-25 DIAGNOSIS — R059 Cough, unspecified: Secondary | ICD-10-CM

## 2016-07-25 DIAGNOSIS — R05 Cough: Secondary | ICD-10-CM | POA: Diagnosis not present

## 2016-07-25 DIAGNOSIS — Z8679 Personal history of other diseases of the circulatory system: Secondary | ICD-10-CM | POA: Diagnosis not present

## 2016-07-25 LAB — POCT CBC
GRANULOCYTE PERCENT: 59.7 % (ref 37–80)
HEMATOCRIT: 36.2 % — AB (ref 37.7–47.9)
Hemoglobin: 11.8 g/dL — AB (ref 12.2–16.2)
LYMPH, POC: 3.1 (ref 0.6–3.4)
MCH, POC: 23.3 pg — AB (ref 27–31.2)
MCHC: 32.7 g/dL (ref 31.8–35.4)
MCV: 71.2 fL — AB (ref 80–97)
MID (CBC): 0.8 (ref 0–0.9)
MPV: 7.7 fL (ref 0–99.8)
PLATELET COUNT, POC: 203 10*3/uL (ref 142–424)
POC Granulocyte: 5.7 (ref 2–6.9)
POC LYMPH %: 31.9 % (ref 10–50)
POC MID %: 8.4 %M (ref 0–12)
RBC: 5.07 M/uL (ref 4.04–5.48)
RDW, POC: 15.4 %
WBC: 9.6 10*3/uL (ref 4.6–10.2)

## 2016-07-25 MED ORDER — AZITHROMYCIN 250 MG PO TABS: ORAL_TABLET | ORAL | 0 refills | Status: DC

## 2016-07-25 MED ORDER — HYDROCODONE-HOMATROPINE 5-1.5 MG/5ML PO SYRP: 5.0000 mL | ORAL_SOLUTION | Freq: Two times a day (BID) | ORAL | 0 refills | Status: DC

## 2016-07-25 NOTE — Patient Instructions (Addendum)
     IF you received an x-ray today, you will receive an invoice from [facility]. Please contact [facility] at [phone] with questions or concerns regarding your invoice.   IF you received labwork today, you will receive an invoice from [facility]. Please contact [facility] at [phone] with questions or concerns regarding your invoice.   Our billing staff will not be able to assist you with questions regarding bills from these companies.  You will be contacted with the lab results as soon as they are available. The fastest way to get your results is to activate your My Chart account. Instructions are located on the last page of this paperwork. If you have not heard from us regarding the results in 2 weeks, please contact this office.     We recommend that you schedule a mammogram for breast cancer screening. Typically, you do not need a referral to do this. Please contact a local imaging center to schedule your mammogram.  [facility] - [phone]  *ask for the Radiology Department [facility]) - [phone] or [phone]  [facility] - [phone] [facility] - [phone] [facility] - [phone]  *ask for the Radiology Department [facility] - [phone]  *ask for the Radiology Department [facility] - [phone]  *ask for the Mammography Department [facility] - [phone]  

## 2016-07-25 NOTE — Progress Notes (Signed)
07/25/2016 4:35 PM   DOB: [birth year] / MRN: [id number]  SUBJECTIVE:  Emily Mathews is a 70 y.o. female presenting for   She is allergic to penicillins and celebrex [celecoxib].   She  has a past medical history of Diabetes mellitus without complication (HCC); Heart murmur; Hypertension; and Hypertrophic obstructive cardiomyopathy (HCC).    She  reports that she has never smoked. She does not have any smokeless tobacco history on file. She reports that she does not drink alcohol or use drugs. She  has no sexual activity history on file. The patient  has a past surgical history that includes Cesarean section; Abdominal hysterectomy; transthoracic echocardiogram (09/2012); and Cardiac catheterization (12/29/2008).  Her family history includes Diabetes in her mother and sister; Heart disease in her mother; Hyperlipidemia in her mother and sister; Hypertension in her mother and sister; Stroke in her mother and sister.  Review of Systems  Cardiovascular: Negative for chest pain.  Skin: Negative for itching and rash.  Neurological: Negative for dizziness.    The problem list and medications were reviewed and updated by myself where necessary and exist elsewhere in the encounter.   OBJECTIVE:  BP 130/80 (BP Location: Right Arm, Patient Position: Sitting, Cuff Size: Normal)   Pulse 82   Temp 98.5 F (36.9 C) (Oral)   Resp 17   Ht 5' (1.524 m)   Wt 142 lb (64.4 kg)   SpO2 98%   BMI 27.73 kg/m   Physical Exam  Constitutional: She is oriented to person, place, and time.  Cardiovascular: Normal rate, regular rhythm and normal heart sounds.   Pulmonary/Chest: Effort normal and breath sounds normal. She has no wheezes. She has no rales.  Musculoskeletal: Normal range of motion.  Neurological: She is alert and oriented to person, place, and time. She displays normal reflexes. No cranial nerve deficit. She exhibits normal muscle tone. Coordination normal.  Skin: Skin is warm and dry.    Psychiatric: She has a normal mood and affect.    Results for orders placed or performed in visit on 07/25/16 (from the past 72 hour(s))  POCT CBC     Status: Abnormal   Collection Time: 07/25/16  4:27 PM  Result Value Ref Range   WBC 9.6 4.6 - 10.2 K/uL   Lymph, poc 3.1 0.6 - 3.4   POC LYMPH PERCENT 31.9 10 - 50 %L   MID (cbc) 0.8 0 - 0.9   POC MID % 8.4 0 - 12 %M   POC Granulocyte 5.7 2 - 6.9   Granulocyte percent 59.7 37 - 80 %G   RBC 5.07 4.04 - 5.48 M/uL   Hemoglobin 11.8 (A) 12.2 - 16.2 g/dL   HCT, POC 40.936.2 (A) 81.137.7 - 47.9 %   MCV 71.2 (A) 80 - 97 fL   MCH, POC 23.3 (A) 27 - 31.2 pg   MCHC 32.7 31.8 - 35.4 g/dL   RDW, POC 91.415.4 %   Platelet Count, POC 203 142 - 424 K/uL   MPV 7.7 0 - 99.8 fL    Dg Chest 2 View  Result Date: 07/25/2016 CLINICAL DATA:  Cough. EXAM: CHEST  2 VIEW COMPARISON:  12/27/2008. FINDINGS: Trachea is midline. Heart size normal. Lungs are somewhat low in volume but clear. No pleural fluid. IMPRESSION: No acute findings. Electronically Signed   By: Leanna BattlesMelinda  Blietz M.D.   On: 07/25/2016 16:11    ASSESSMENT AND PLAN  Julieanne CottonJosephine was seen today for cough.  Diagnoses and all orders for this  visit:  Cough: Rads reassuring from a cardiac standpoint and she is not having chest pain, orthopnea, new SOB.  Given she is elderly with comorbidity I will cover for an atypical infection with Azthromycin. Cough syrup prn for the next five days.  -     POCT CBC -     DG Chest 2 View; Future  History of cardiomegaly -     Brain natriuretic peptide    The patient is advised to call or return to clinic if she does not see an improvement in symptoms, or to seek the care of the closest emergency department if she worsens with the above plan.   Deliah BostonMichael Elizabth Palka, MHS, PA-C [facility] 07/25/2016 4:35 PM

## 2016-07-25 NOTE — Progress Notes (Signed)
07/25/2016 4:18 PM   DOB: 08-Oct-1945 / MRN: [id number]  SUBJECTIVE:  Emily Mathews is a 70 y.o. female with a history of DM2 presenting for cough that started three days ago. She associates congestion in her chest.  She associates nasal congestion and rhinorhea, sore throat, sweats and chills that are temporary.   She denies chest pain, leg swelling, fever, orthopnea. She has tried some tussin and this did not work along with Mucinex.    She has a history of cardiomyopaty and her last echo was in 2014.  [facility].    She is allergic to penicillins and celebrex [celecoxib].   She  has a past medical history of Diabetes mellitus without complication (HCC); Heart murmur; Hypertension; and Hypertrophic obstructive cardiomyopathy (HCC).    She  reports that she has never smoked. She does not have any smokeless tobacco history on file. She reports that she does not drink alcohol or use drugs. She  has no sexual activity history on file. The patient  has a past surgical history that includes Cesarean section; Abdominal hysterectomy; transthoracic echocardiogram (09/2012); and Cardiac catheterization (12/29/2008).  Her family history includes Diabetes in her mother and sister; Heart disease in her mother; Hyperlipidemia in her mother and sister; Hypertension in her mother and sister; Stroke in her mother and sister.  Review of Systems  Constitutional: Negative for chills and fever.  Respiratory: Positive for cough and sputum production. Negative for hemoptysis, shortness of breath and wheezing.   Musculoskeletal: Negative for myalgias.  Skin: Negative for itching and rash.  Neurological: Negative for dizziness.    The problem list and medications were reviewed and updated by myself where necessary and exist elsewhere in the encounter.   OBJECTIVE:  BP 130/80 (BP Location: Right Arm, Patient Position: Sitting, Cuff Size: Normal)   Pulse 82   Temp 98.5 F (36.9 C) (Oral)    Resp 17   Ht 5' (1.524 m)   Wt 142 lb (64.4 kg)   SpO2 98%   BMI 27.73 kg/m o  Physical Exam  Constitutional: She is oriented to person, place, and time. She appears well-developed and well-nourished. No distress.  Cardiovascular: Normal rate and regular rhythm.   Murmur (2/6 systolic ejection murmur) heard. Pulmonary/Chest: Effort normal and breath sounds normal.  Abdominal: Soft. Bowel sounds are normal.  Musculoskeletal: Normal range of motion.  Neurological: She is alert and oriented to person, place, and time.  Skin: Skin is warm and dry. She is not diaphoretic.  Psychiatric: She has a normal mood and affect.    No results found for this or any previous visit (from the past 72 hour(s)).  Dg Chest 2 View  Result Date: 07/25/2016 CLINICAL DATA:  Cough. EXAM: CHEST  2 VIEW COMPARISON:  12/27/2008. FINDINGS: Trachea is midline. Heart size normal. Lungs are somewhat low in volume but clear. No pleural fluid. IMPRESSION: No acute findings. Electronically Signed   By: Leanna BattlesMelinda  Blietz M.D.   On: 07/25/2016 16:11    ASSESSMENT AND PLAN  Emily CottonJosephine was seen today for cough.  Diagnoses and all orders for this visit:  Cough -     POCT CBC -     DG Chest 2 View; Future  History of cardiomegaly -     Brain natriuretic peptide    The patient is advised to call or return to clinic if she does not see an improvement in symptoms, or to seek the care of the closest emergency department if  she worsens with the above plan.   Deliah BostonMichael Dequandre Cordova, MHS, PA-C [facility] 07/25/2016 4:18 PM

## 2016-07-26 LAB — BRAIN NATRIURETIC PEPTIDE: BRAIN NATRIURETIC PEPTIDE: 131 pg/mL — AB (ref <100)

## 2017-01-26 ENCOUNTER — Encounter: Payer: Self-pay | Admitting: Gynecology

## 2017-01-26 ENCOUNTER — Ambulatory Visit (INDEPENDENT_AMBULATORY_CARE_PROVIDER_SITE_OTHER): Payer: BLUE CROSS/BLUE SHIELD | Admitting: Gynecology

## 2017-01-26 VITALS — BP 124/78 | Ht 59.0 in | Wt 141.0 lb

## 2017-01-26 DIAGNOSIS — N952 Postmenopausal atrophic vaginitis: Secondary | ICD-10-CM | POA: Diagnosis not present

## 2017-01-26 DIAGNOSIS — Z01411 Encounter for gynecological examination (general) (routine) with abnormal findings: Secondary | ICD-10-CM | POA: Diagnosis not present

## 2017-01-26 DIAGNOSIS — R1030 Lower abdominal pain, unspecified: Secondary | ICD-10-CM | POA: Diagnosis not present

## 2017-01-26 DIAGNOSIS — N898 Other specified noninflammatory disorders of vagina: Secondary | ICD-10-CM | POA: Diagnosis not present

## 2017-01-26 LAB — WET PREP FOR TRICH, YEAST, CLUE
Trich, Wet Prep: NONE SEEN
Yeast Wet Prep HPF POC: NONE SEEN

## 2017-01-26 MED ORDER — CLINDAMYCIN PHOSPHATE 2 % VA CREA: 1.0000 | TOPICAL_CREAM | Freq: Every day | VAGINAL | 0 refills | Status: DC

## 2017-01-26 NOTE — Addendum Note (Signed)
Addended by: Dayna BarkerGARDNER, Christphor Groft K on: 01/26/2017 04:37 PM   Modules accepted: Orders

## 2017-01-26 NOTE — Patient Instructions (Signed)
Follow up for ultrasound as scheduled 

## 2017-01-26 NOTE — Progress Notes (Signed)
    Emily Mathews [birth year] [id number]        71 y.o.  [id number] new patient for annual exam who has not had a gynecologic exam a number of years.  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 Reports no other gynecologic abnormalities or abnormal Pap smears in the past. Also is complaining of a one-month history of vaginal discharge with irritation. Had been treated for yeast last month. Also with lower abdominal aching discomfort. Comes and goes. No nausea vomiting diarrhea constipation. No frequency dysuria or urgency low back pain fever or chills. No history of same before.  Past medical history,surgical history, problem list, medications, allergies, family history and social history were all reviewed and documented as reviewed in the EPIC chart.  ROS:  Performed with pertinent positives and negatives included in the history, assessment and plan.   Additional significant findings :  None   Exam: Kennon PortelaKim Gardner assistant Vitals:   01/26/17 1540  BP: 124/78  Weight: 141 lb (64 kg)  Height: 4\' 11"  (1.499 m)   Body mass index is 28.48 kg/m.  General appearance:  Normal affect, orientation and appearance. Skin: Grossly normal HEENT: Without gross lesions.  No cervical or supraclavicular adenopathy. Thyroid normal.  Lungs:  Clear without wheezing, rales or rhonchi Cardiac: RR, without RMG Abdominal:  Soft, nontender, without masses, guarding, rebound, organomegaly or hernia Breasts:  Examined lying and sitting without masses, retractions, discharge or axillary adenopathy. Pelvic:  Ext, BUS, Vagina: With atrophic changes. Frothy yellow discharge. Wet prep and Pap smear done  Adnexa: Without masses or tenderness    Anus and perineum: Normal   Rectovaginal: Normal sphincter tone without palpated masses or tenderness.    Assessment/Plan:  71 y.o. [name] female for annual exam.   1. Vaginal discharge. Wet prep is consistent with bacterial vaginosis.  Discussed treatment options. Patient elects for Cleocin vaginal cream nightly 7 days. Follow up if symptoms persist, worsen or recur. 2. Lower abdominal discomfort. Comes and goes vague in nature. No associated GI or GU symptoms. Is status post hysterectomy but believes she still has her ovaries. Exam is somewhat limited by abdominal girth. Will schedule surveillance ultrasound to look at her ovaries to rule out ovarian disease. Assuming negative she is in the process of arranging for her colonoscopy as she is 10 years out and will follow up with them in reference to this also. 3. Mammography coming due this summer and I reminded her to schedule this. SBE monthly reviewed. 4. Bone density reportedly done this year. I do not have a copy of this and she'll continue to follow up with her primary physician in reference to her bone health. 5. Pap smear a number of years ago. Pap smear of vaginal cuff done. She has no history of abnormal Pap smears preceding her hysterectomy. 6. Health maintenance. No routine lab work done as patient does this elsewhere. Follow up for ultrasound. Follow up annual exam in one year.   Additional time in excess of her gynecologic exam was spent in direct face to face counseling and coordination of care in regards to her vaginal discharge and lower abdominal discomfort.    Dara LordsFONTAINE,Rahman Ferrall P MD, 4:10 PM 01/26/2017

## 2017-01-27 LAB — URINALYSIS W MICROSCOPIC + REFLEX CULTURE
BACTERIA UA: NONE SEEN [HPF]
BILIRUBIN URINE: NEGATIVE
CASTS: NONE SEEN [LPF]
CRYSTALS: NONE SEEN [HPF]
Glucose, UA: NEGATIVE
Hgb urine dipstick: NEGATIVE
KETONES UR: NEGATIVE
Nitrite: NEGATIVE
PROTEIN: NEGATIVE
RBC / HPF: NONE SEEN RBC/HPF (ref <=2)
SPECIFIC GRAVITY, URINE: 1.017 (ref 1.001–1.035)
Squamous Epithelial / LPF: NONE SEEN [HPF] (ref <=5)
Yeast: NONE SEEN [HPF]
pH: 5 (ref 5.0–8.0)

## 2017-01-27 LAB — PAP IG W/ RFLX HPV ASCU

## 2017-01-28 LAB — URINE CULTURE

## 2017-02-22 ENCOUNTER — Ambulatory Visit (INDEPENDENT_AMBULATORY_CARE_PROVIDER_SITE_OTHER): Payer: BLUE CROSS/BLUE SHIELD

## 2017-02-22 ENCOUNTER — Encounter: Payer: Self-pay | Admitting: Gynecology

## 2017-02-22 ENCOUNTER — Ambulatory Visit (INDEPENDENT_AMBULATORY_CARE_PROVIDER_SITE_OTHER): Payer: BLUE CROSS/BLUE SHIELD | Admitting: Gynecology

## 2017-02-22 VITALS — BP 124/80

## 2017-02-22 DIAGNOSIS — R140 Abdominal distension (gaseous): Secondary | ICD-10-CM | POA: Diagnosis not present

## 2017-02-22 DIAGNOSIS — R1030 Lower abdominal pain, unspecified: Secondary | ICD-10-CM | POA: Diagnosis not present

## 2017-02-22 NOTE — Progress Notes (Signed)
    Emily Mathews [birth year] [id number]        71 y.o.  [id number] presents for ultrasound with history of ill-defined abdominal bloating. On questioning today it seems to be upper abdominal particular the following eating where she will frequently have bouts of diarrhea following. Ultrasound today ordered to rule out ovarian disease.  Past medical history,surgical history, problem list, medications, allergies, family history and social history were all reviewed and documented in the EPIC chart.  Directed ROS with pertinent positives and negatives documented in the history of present illness/assessment and plan.  Exam: Vitals:   02/22/17 1533  BP: 124/80   General appearance:  Normal  Ultrasound transvaginal and transabdominal status post hysterectomy. Right and left ovaries visualized and atrophic. Small adjacent right ovarian echo-free cyst 7 x 9 mm. Cul-de-sac negative. Vaginal cuff negative.  Assessment/Plan:  [name] with abdominal bloating suspicious for GI. GYN ultrasound is normal. No evidence of ovarian disease. Recommend patient start with follow up with her primary physician in reference to her metformin. Ultimately will follow up with her gastroenterologist if her symptoms continue.    Dara LordsFONTAINE,Korryn Pancoast P MD, 3:45 PM 02/22/2017

## 2017-02-22 NOTE — Patient Instructions (Signed)
Follow up with your primary physician in reference to your metformin to see if this would not help as far as her abdominal bloating. Ultimately follow up with your gastroenterologist if the symptoms continue.

## 2018-08-19 ENCOUNTER — Other Ambulatory Visit: Payer: Self-pay

## 2018-08-19 ENCOUNTER — Emergency Department (HOSPITAL_COMMUNITY)
Admission: EM | Admit: 2018-08-19 | Discharge: 2018-08-19 | Disposition: A | Discharge status: Home / Self Care | Payer: BLUE CROSS/BLUE SHIELD | Attending: Emergency Medicine | Admitting: Emergency Medicine

## 2018-08-19 ENCOUNTER — Encounter (HOSPITAL_COMMUNITY): Payer: Self-pay | Admitting: Emergency Medicine

## 2018-08-19 ENCOUNTER — Emergency Department (HOSPITAL_COMMUNITY): Payer: BLUE CROSS/BLUE SHIELD

## 2018-08-19 DIAGNOSIS — I10 Essential (primary) hypertension: Secondary | ICD-10-CM | POA: Diagnosis not present

## 2018-08-19 DIAGNOSIS — E119 Type 2 diabetes mellitus without complications: Secondary | ICD-10-CM | POA: Insufficient documentation

## 2018-08-19 DIAGNOSIS — R52 Pain, unspecified: Secondary | ICD-10-CM | POA: Insufficient documentation

## 2018-08-19 DIAGNOSIS — Z79899 Other long term (current) drug therapy: Secondary | ICD-10-CM | POA: Diagnosis not present

## 2018-08-19 MED ORDER — ACETAMINOPHEN 325 MG PO TABS
650.0000 mg | ORAL_TABLET | Freq: Once | ORAL | Status: AC
  Administered 2018-08-19: 650 mg via ORAL
  Filled 2018-08-19: qty 2

## 2018-08-19 MED ORDER — TRAMADOL HCL 50 MG PO TABS: 50.0000 mg | ORAL_TABLET | Freq: Four times a day (QID) | ORAL | 0 refills | Status: DC | PRN

## 2018-08-19 NOTE — Discharge Instructions (Signed)
Follow-up with your doctor this week for recheck if any problems

## 2018-08-19 NOTE — ED Notes (Signed)
Pt verbalized understanding of d/c instructions and has no further questions, VSS, NAD.  
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
## 2018-08-23 ENCOUNTER — Emergency Department (HOSPITAL_COMMUNITY): Payer: BLUE CROSS/BLUE SHIELD

## 2018-08-23 ENCOUNTER — Observation Stay (HOSPITAL_BASED_OUTPATIENT_CLINIC_OR_DEPARTMENT_OTHER): Payer: BLUE CROSS/BLUE SHIELD

## 2018-08-23 ENCOUNTER — Observation Stay (HOSPITAL_COMMUNITY)
Admission: EM | Admit: 2018-08-23 | Discharge: 2018-08-24 | Disposition: A | Discharge status: Home / Self Care | Payer: BLUE CROSS/BLUE SHIELD | Attending: Student in an Organized Health Care Education/Training Program | Admitting: Student in an Organized Health Care Education/Training Program

## 2018-08-23 ENCOUNTER — Other Ambulatory Visit: Payer: Self-pay

## 2018-08-23 ENCOUNTER — Encounter (HOSPITAL_COMMUNITY): Payer: Self-pay | Admitting: *Deleted

## 2018-08-23 DIAGNOSIS — R0789 Other chest pain: Secondary | ICD-10-CM | POA: Diagnosis not present

## 2018-08-23 DIAGNOSIS — Z888 Allergy status to other drugs, medicaments and biological substances status: Secondary | ICD-10-CM

## 2018-08-23 DIAGNOSIS — Z7982 Long term (current) use of aspirin: Secondary | ICD-10-CM | POA: Diagnosis not present

## 2018-08-23 DIAGNOSIS — Z880 Allergy status to penicillin: Secondary | ICD-10-CM | POA: Diagnosis not present

## 2018-08-23 DIAGNOSIS — I340 Nonrheumatic mitral (valve) insufficiency: Secondary | ICD-10-CM | POA: Insufficient documentation

## 2018-08-23 DIAGNOSIS — I421 Obstructive hypertrophic cardiomyopathy: Secondary | ICD-10-CM | POA: Diagnosis not present

## 2018-08-23 DIAGNOSIS — D509 Iron deficiency anemia, unspecified: Secondary | ICD-10-CM | POA: Diagnosis not present

## 2018-08-23 DIAGNOSIS — I472 Ventricular tachycardia: Secondary | ICD-10-CM | POA: Diagnosis not present

## 2018-08-23 DIAGNOSIS — K219 Gastro-esophageal reflux disease without esophagitis: Secondary | ICD-10-CM | POA: Diagnosis not present

## 2018-08-23 DIAGNOSIS — E785 Hyperlipidemia, unspecified: Secondary | ICD-10-CM | POA: Insufficient documentation

## 2018-08-23 DIAGNOSIS — Z833 Family history of diabetes mellitus: Secondary | ICD-10-CM

## 2018-08-23 DIAGNOSIS — N183 Chronic kidney disease, stage 3 (moderate): Secondary | ICD-10-CM | POA: Diagnosis not present

## 2018-08-23 DIAGNOSIS — Z79899 Other long term (current) drug therapy: Secondary | ICD-10-CM

## 2018-08-23 DIAGNOSIS — E559 Vitamin D deficiency, unspecified: Secondary | ICD-10-CM | POA: Insufficient documentation

## 2018-08-23 DIAGNOSIS — Z950 Presence of cardiac pacemaker: Secondary | ICD-10-CM | POA: Diagnosis not present

## 2018-08-23 DIAGNOSIS — Z886 Allergy status to analgesic agent status: Secondary | ICD-10-CM | POA: Insufficient documentation

## 2018-08-23 DIAGNOSIS — I422 Other hypertrophic cardiomyopathy: Secondary | ICD-10-CM

## 2018-08-23 DIAGNOSIS — E1122 Type 2 diabetes mellitus with diabetic chronic kidney disease: Secondary | ICD-10-CM | POA: Diagnosis not present

## 2018-08-23 DIAGNOSIS — R001 Bradycardia, unspecified: Secondary | ICD-10-CM | POA: Diagnosis present

## 2018-08-23 DIAGNOSIS — N289 Disorder of kidney and ureter, unspecified: Secondary | ICD-10-CM

## 2018-08-23 DIAGNOSIS — E872 Acidosis, unspecified: Secondary | ICD-10-CM

## 2018-08-23 DIAGNOSIS — I4891 Unspecified atrial fibrillation: Secondary | ICD-10-CM | POA: Diagnosis not present

## 2018-08-23 DIAGNOSIS — E860 Dehydration: Secondary | ICD-10-CM | POA: Diagnosis not present

## 2018-08-23 DIAGNOSIS — I129 Hypertensive chronic kidney disease with stage 1 through stage 4 chronic kidney disease, or unspecified chronic kidney disease: Secondary | ICD-10-CM | POA: Diagnosis not present

## 2018-08-23 DIAGNOSIS — Z794 Long term (current) use of insulin: Secondary | ICD-10-CM | POA: Insufficient documentation

## 2018-08-23 DIAGNOSIS — Z8249 Family history of ischemic heart disease and other diseases of the circulatory system: Secondary | ICD-10-CM | POA: Insufficient documentation

## 2018-08-23 DIAGNOSIS — R079 Chest pain, unspecified: Secondary | ICD-10-CM | POA: Diagnosis not present

## 2018-08-23 DIAGNOSIS — Z8639 Personal history of other endocrine, nutritional and metabolic disease: Secondary | ICD-10-CM

## 2018-08-23 DIAGNOSIS — I4729 Other ventricular tachycardia: Secondary | ICD-10-CM

## 2018-08-23 LAB — CBC
HEMATOCRIT: 41.9 % (ref 36.0–46.0)
HEMOGLOBIN: 11.4 g/dL — AB (ref 12.0–15.0)
MCH: 21.3 pg — ABNORMAL LOW (ref 26.0–34.0)
MCHC: 27.2 g/dL — ABNORMAL LOW (ref 30.0–36.0)
MCV: 78.2 fL — ABNORMAL LOW (ref 80.0–100.0)
Platelets: 231 10*3/uL (ref 150–400)
RBC: 5.36 MIL/uL — ABNORMAL HIGH (ref 3.87–5.11)
RDW: 16.1 % — ABNORMAL HIGH (ref 11.5–15.5)
WBC: 7.3 10*3/uL (ref 4.0–10.5)
nRBC: 0 % (ref 0.0–0.2)

## 2018-08-23 LAB — BASIC METABOLIC PANEL
Anion gap: 13 (ref 5–15)
BUN: 29 mg/dL — ABNORMAL HIGH (ref 8–23)
CO2: 18 mmol/L — AB (ref 22–32)
Calcium: 9.3 mg/dL (ref 8.9–10.3)
Chloride: 108 mmol/L (ref 98–111)
Creatinine, Ser: 1.59 mg/dL — ABNORMAL HIGH (ref 0.44–1.00)
GFR calc Af Amer: 37 mL/min — ABNORMAL LOW (ref >60)
GFR calc non Af Amer: 32 mL/min — ABNORMAL LOW (ref >60)
Glucose, Bld: 165 mg/dL — ABNORMAL HIGH (ref 70–99)
POTASSIUM: 4.1 mmol/L (ref 3.5–5.1)
Sodium: 139 mmol/L (ref 135–145)

## 2018-08-23 LAB — MAGNESIUM: Magnesium: 1.8 mg/dL (ref 1.7–2.4)

## 2018-08-23 LAB — I-STAT TROPONIN, ED: Troponin i, poc: 0.01 ng/mL (ref 0.00–0.08)

## 2018-08-23 LAB — ECHOCARDIOGRAM COMPLETE
Height: 59 in
WEIGHTICAEL: 2320.02 [oz_av]

## 2018-08-23 LAB — GLUCOSE, CAPILLARY
Glucose-Capillary: 227 mg/dL — ABNORMAL HIGH (ref 70–99)
Glucose-Capillary: 236 mg/dL — ABNORMAL HIGH (ref 70–99)
Glucose-Capillary: 283 mg/dL — ABNORMAL HIGH (ref 70–99)

## 2018-08-23 LAB — PHOSPHORUS: Phosphorus: 4 mg/dL (ref 2.5–4.6)

## 2018-08-23 LAB — FERRITIN: Ferritin: 110 ng/mL (ref 11–307)

## 2018-08-23 LAB — TSH: TSH: 0.89 u[IU]/mL (ref 0.350–4.500)

## 2018-08-23 LAB — TROPONIN I
TROPONIN I: UNDETERMINED ng/mL (ref <0.03)
Troponin I: <0.03 ng/mL (ref <0.03)

## 2018-08-23 LAB — CBG MONITORING, ED: Glucose-Capillary: 145 mg/dL — ABNORMAL HIGH (ref 70–99)

## 2018-08-23 MED ORDER — ROSUVASTATIN CALCIUM 20 MG PO TABS
40.0000 mg | ORAL_TABLET | Freq: Every day | ORAL | Status: DC
  Administered 2018-08-23 – 2018-08-24 (×2): 40 mg via ORAL
  Filled 2018-08-23 (×2): qty 2

## 2018-08-23 MED ORDER — ASPIRIN EC 81 MG PO TBEC
81.0000 mg | DELAYED_RELEASE_TABLET | Freq: Every day | ORAL | Status: DC
  Administered 2018-08-23 – 2018-08-24 (×2): 81 mg via ORAL
  Filled 2018-08-23 (×2): qty 1

## 2018-08-23 MED ORDER — ENOXAPARIN SODIUM 30 MG/0.3ML ~~LOC~~ SOLN
30.0000 mg | SUBCUTANEOUS | Status: DC
  Administered 2018-08-23: 30 mg via SUBCUTANEOUS
  Filled 2018-08-23: qty 0.3

## 2018-08-23 MED ORDER — PANTOPRAZOLE SODIUM 20 MG PO TBEC
20.0000 mg | DELAYED_RELEASE_TABLET | Freq: Every day | ORAL | Status: DC
  Administered 2018-08-23 – 2018-08-24 (×2): 20 mg via ORAL
  Filled 2018-08-23 (×2): qty 1

## 2018-08-23 MED ORDER — SODIUM CHLORIDE 0.9 % IV SOLN
INTRAVENOUS | Status: AC
  Administered 2018-08-23: 13:00:00 via INTRAVENOUS

## 2018-08-23 MED ORDER — INSULIN ASPART PROT & ASPART (70-30 MIX) 100 UNIT/ML ~~LOC~~ SUSP
12.0000 [IU] | Freq: Two times a day (BID) | SUBCUTANEOUS | Status: DC
  Administered 2018-08-23 – 2018-08-24 (×2): 12 [IU] via SUBCUTANEOUS
  Filled 2018-08-23: qty 10

## 2018-08-23 MED ORDER — ASPIRIN 325 MG PO TABS: 325.0000 mg | ORAL_TABLET | Freq: Once | ORAL | Status: DC

## 2018-08-23 NOTE — ED Provider Notes (Signed)
[facility] EMERGENCY DEPARTMENT Provider Note   CSN: [id number] Arrival date & time: 08/23/18  0445     History   Chief Complaint Chief Complaint  Patient presents with  . Palpitations    HPI Emily Mathews is a 72 y.o. female.  The history is provided by the patient.  She has history of hypertension, diabetes, or hypertrophic obstructive cardiomyopathy and comes in because of slow heartbeat.  She woke up at 3 AM to urinate and noted she felt a little lightheaded.  She checked her heart rate and it was in the 40s.  Normal heart rate is around 60.  Blood pressure also was as low as 90.  There is very mild associated chest tightness and nausea.  She denies dyspnea or diaphoresis.  She does note that she had a prescription for diltiazem refilled about 1 week ago, and it is a different color and different style tablet.  Past Medical History:  Diagnosis Date  . Diabetes mellitus without complication (HCC)   . Heart murmur   . Hypertension   . Hypertrophic obstructive cardiomyopathy [facility])     Patient Active Problem List   Diagnosis Date Noted  . Dyspnea 02/11/2014  . HTN (hypertension) 10/11/2013  . DM (diabetes mellitus) (HCC) 10/11/2013    Past Surgical History:  Procedure Laterality Date  . ABDOMINAL HYSTERECTOMY    . CARDIAC CATHETERIZATION  12/29/2008   normal L main, LAD free of siease, Cfx free of disease, normal RCA, hypertrophic cardiomyopathy with 60mm subvalvular to apex gradient (Dr. Mervyn Skeeters. [place])  . CESAREAN SECTION    . TRANSTHORACIC ECHOCARDIOGRAM  09/2012   EF 65-70%, severe septal hypertrophy, grade 1 diastolic dysfunction; LA in upper limites of normal in size     OB History    Gravida  7   Para  5   Term      Preterm      AB  2   Living  5     SAB  2   TAB      Ectopic      Multiple      Live Births               Home Medications    Prior to Admission medications   Medication Sig Start Date End Date Taking?  Authorizing Provider  clindamycin (CLEOCIN) 2 % vaginal cream Place 1 Applicatorful vaginally at bedtime. Patient not taking: Reported on 02/22/2017 01/26/17   Fontaine, Nadyne Coombes, MD  diltiazem (CARDIZEM CD) 240 MG 24 hr capsule Take 240 mg by mouth daily.    [provider]  insulin lispro protamine-lispro (HUMALOG 75/25 MIX) (75-25) 100 UNIT/ML SUSP injection Inject into the skin.    [provider]  Liraglutide (VICTOZA) 18 MG/3ML SOPN Inject into the skin daily.    [provider]  losartan-hydrochlorothiazide (HYZAAR) 100-25 MG per tablet Take 0.5 tablets by mouth daily. 06/24/14   Hilty, Lisette Abu, MD  metFORMIN (GLUCOPHAGE) 500 MG tablet Take by mouth 2 (two) times daily with a meal.    [provider]  metoprolol (LOPRESSOR) 50 MG tablet Take 1.5 tablets (75 mg total) by mouth 2 (two) times daily. 01/03/14   Hilty, Lisette Abu, MD  pantoprazole (PROTONIX) 40 MG tablet Take 40 mg by mouth daily.    [provider]  polyethylene glycol (MIRALAX / GLYCOLAX) packet Take 17 g by mouth 2 (two) times daily. 07/14/15   Guinevere Scarlet, MD  rosuvastatin (CRESTOR) 40 MG tablet  Take 40 mg by mouth daily.    [provider]  traMADol (ULTRAM) 50 MG tablet Take 1 tablet (50 mg total) by mouth every 6 (six) hours as needed. 08/19/18   Bethann BerkshireZammit, Joseph, MD    Family History Family History  Problem Relation Age of Onset  . Diabetes Mother   . Heart disease Mother   . Hypertension Mother   . Stroke Mother   . Hyperlipidemia Mother   . Diabetes Sister   . Hypertension Sister   . Stroke Sister   . Hyperlipidemia Sister     Social History Social History   Tobacco Use  . Smoking status: Never Smoker  . Smokeless tobacco: Never Used  Substance Use Topics  . Alcohol use: Yes    Comment: Occas  . Drug use: No     Allergies   Penicillins and Celebrex [celecoxib]   Review of Systems Review of Systems  All other systems reviewed and are  negative.    Physical Exam Updated Vital Signs BP 111/66 (BP Location: Right Arm)   Pulse (!) 44   Temp 98.3 F (36.8 C) (Oral)   Resp 18   Ht 4\' 11"  (1.499 m)   Wt 65.8 kg   SpO2 100%   BMI 29.29 kg/m   Physical Exam  Nursing note and vitals reviewed.  72 year old female, resting comfortably and in no acute distress. Vital signs are significant for low heart rate. Oxygen saturation is 100%, which is normal. Head is normocephalic and atraumatic. PERRLA, EOMI. Oropharynx is clear. Neck is nontender and supple without adenopathy or JVD. Back is nontender and there is no CVA tenderness. Lungs are clear without rales, wheezes, or rhonchi. Chest is nontender. Heart is bradycardic without murmur. Abdomen is soft, flat, nontender without masses or hepatosplenomegaly and peristalsis is normoactive. Extremities have no cyanosis or edema, full range of motion is present. Skin is warm and dry without rash. Neurologic: Mental status is normal, cranial nerves are intact, there are no motor or sensory deficits.  ED Treatments / Results  Labs (all labs ordered are listed, but only abnormal results are displayed) Labs Reviewed  CBC - Abnormal; Notable for the following components:      Result Value   RBC 5.36 (*)    Hemoglobin 11.4 (*)    MCV 78.2 (*)    MCH 21.3 (*)    MCHC 27.2 (*)    RDW 16.1 (*)    All other components within normal limits  BASIC METABOLIC PANEL  I-STAT TROPONIN, ED    EKG EKG Interpretation  Date/Time:  Thursday August 23 2018 04:54:30 EST Ventricular Rate:  49 PR Interval:    QRS Duration: 86 QT Interval:  498 QTC Calculation: 449 R Axis:   16 Text Interpretation:  Atrial fibrillation with slow ventricular response with a competing junctional pacemaker ST & T wave abnormality, consider inferior ischemia ST & T wave abnormality, consider anterolateral ischemia Abnormal ECG When compared with ECG of 12/29/2008, Atrial fibrillation with slow ventricular  response has replaced Sinus rhythm ST-T waves are unchanged Confirmed by Dione BoozeGlick, Jacquline Terrill ([id number]) on 08/23/2018 5:00:13 AM   Radiology Dg Chest 2 View  Result Date: 08/23/2018 CLINICAL DATA:  72 year old female with chest pain. EXAM: CHEST - 2 VIEW COMPARISON:  Chest radiograph dated 07/25/2016 FINDINGS: The heart size and mediastinal contours are within normal limits. Both lungs are clear. The visualized skeletal structures are unremarkable. IMPRESSION: No active cardiopulmonary disease. Electronically Signed   By: Burtis JunesArash  Radparvar M.D.   On: 08/23/2018 05:32    Procedures Procedures   Medications Ordered in ED Medications - No data to display   Initial Impression / Assessment and Plan / ED Course  I have reviewed the triage vital signs and the nursing notes.  Pertinent labs & imaging results that were available during my care of the patient were reviewed by me and considered in my medical decision making (see chart for details).  Bradycardia.  ECG done here looks like atrial fibrillation.  On monitor, there appear to be retrograde P waves that might suggest junctional escape rhythm at one point on the monitor, she had a 5 beat run of ventricular tachycardia.  Change in heart rate could be related to difference in bioavailability of the diltiazem tablet.  She is also taking a beta-blocker.  She will need to be admitted for careful monitoring.  She will need to have beta-blocker and calcium channel blocker held and will need to monitor heart rate response to discontinuing them.   6:50 AM Monitor now shows sinus rhythm at 55 bpm.  She will still need observation and monitoring.  I suspect she would benefit from decrease in her beta-blocker dose.  Labs show stable renal insufficiency.  CO2 is low but with normal anion gap.  She has mild microcytic anemia which is unchanged from baseline.  7:37 AM Case is discussed with Dr. Milagros Reap of internal medicine teaching service, who agrees to admit the  patient.  Final Clinical Impressions(s) / ED Diagnoses   Final diagnoses:  Bradycardia  Ventricular tachycardia, nonsustained (HCC)  Renal insufficiency  Metabolic acidosis    ED Discharge Orders    None       Dione Booze, MD 08/23/18 [id number]

## 2018-08-23 NOTE — Progress Notes (Signed)
  Echocardiogram 2D Echocardiogram has been performed.  Emily Mathews 08/23/2018, 5:23 PM

## 2018-08-23 NOTE — ED Notes (Signed)
ED Provider at bedside. 

## 2018-08-23 NOTE — ED Triage Notes (Signed)
Pt states she woke up about 0300 and she started feeling a "fluttering" in her chest and her chest became tight, associated with dizziness.

## 2018-08-23 NOTE — ED Notes (Signed)
Patient transported to X-ray 

## 2018-08-23 NOTE — H&P (Addendum)
Date: 08/23/2018               Patient Name:  Emily Mathews MRN: [id number]  DOB: 1945-12-26 Age / Sex: 72 y.o., female   PCP: Loyal Jacobson, MD         Medical Service: Internal Medicine Teaching Service         Attending Physician: Dr. Oswaldo Done, Marquita Palms, *    First Contact: Dr. Criss Alvine  Pager: [phone]  Second Contact: Dr. Frances Furbish  Pager: [phone]       After Hours (After 5p/  First Contact Pager: [phone]  weekends / holidays): Second Contact Pager: [phone]   Chief Complaint: palpitations   History of Present Illness:  72 year old woman with history of hypertrophic cardiomyopathy with diastolic dysfunction, hypertension, hyperlipidemia, diabetes requiring insulin with hypoglycemia, microcytic anemia, GERD, hx of hypomagnesemia, vitamin D deficiency. She woke up at 3 AM to use the restroom and noticed a sensation of palpitations. She obtained her blood pressure cuff and found a HR of 40 with SBP 90 and blood glucose of 50. Then she began to realize she had a severe chest tightness and pressure in the center of her chest and felt lightheaded. She drank a coke and her blood glucose improved. Her son drove her to the emergency department and her chest pressure improved to 8/10 when she arrived and is 2/10 now. The dizziness and palpitations have resolved. The chest pain is non radiating and not associated with nausea, shortness of breath, or diaphoresis. She does not have chest wall tenderness and the pain doesn't worsen with deep breathing. She has a history of heartburn but this pain is different.   In July she experienced similar symptoms. Her cardiologist converted from metoprolol tartrate 75 mg BID to succinate 50 mg qd. She has no history of myocardial infarction.   She was diagnosed with hypertrophic cardiomyopathy prior to 2014, initially she was noted to have outflow tract obstruction and was started on beta blockers and anterior motion of the mitral valve was not noted  on follow up echo.    She is accustom to frequently hypoglycemia at night, usually associated with diaphoresis, weakness, lightheadedness.    At baseline she experiences shortness of breath with exertion but never angina.   Meds: Current Meds  Medication Sig  . alendronate (FOSAMAX) 70 MG tablet Take 70 mg by mouth once a week. Take with a full glass of water on an empty stomach.  Marland Kitchen aspirin EC 81 MG tablet Take 81 mg by mouth daily.  . calcium carbonate (OS-CAL - DOSED IN MG OF ELEMENTAL CALCIUM) 1250 (500 Ca) MG tablet Take 1 tablet by mouth daily.  . cholecalciferol (VITAMIN D3) 25 MCG (1000 UT) tablet Take 2,000 Units by mouth daily.  Marland Kitchen diltiazem (CARDIZEM CD) 240 MG 24 hr capsule Take 240 mg by mouth daily.  . insulin aspart protamine- aspart (NOVOLOG MIX 70/30) (70-30) 100 UNIT/ML injection Inject 0-50 Units into the skin 3 (three) times daily with meals.  . Liraglutide (VICTOZA) 18 MG/3ML SOPN Inject 18 mg into the skin daily.   . metoprolol succinate (TOPROL-XL) 50 MG 24 hr tablet Take 50 mg by mouth daily. Take with or immediately following a meal.  . pantoprazole (PROTONIX) 40 MG tablet Take 20 mg by mouth daily.   . rosuvastatin (CRESTOR) 40 MG tablet Take 40 mg by mouth daily.  Marland Kitchen telmisartan-hydrochlorothiazide (MICARDIS HCT) 80-25 MG tablet Take 1 tablet by mouth daily.  Allergies: Allergies as of 08/23/2018 - Review Complete 08/23/2018  Allergen Reaction Noted  . Celebrex [celecoxib] Rash 10/11/2013  . Penicillins Rash 07/14/2015   Past Medical History:  Diagnosis Date  . Diabetes mellitus without complication (HCC)   . Heart murmur   . Hypertension   . Hypertrophic obstructive cardiomyopathy (HCC)     Family History: mother - MI in late 4860s, DM, HTN, hypertrophic cardiomyopathy ; sister- stroke, DM, HTN     Social History:  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 She has four sons and one daughter, is divorced. She was living on her own but 6 months ago one of her sons moved into her home. She used to work at two skilled nursing facilities, [employer] for 3rd shift and an additional 9-5 position at a different facility, a few months ago she retired from the 9-5 and now she just works 3rd shift.     Review of Systems: A complete ROS was negative except as per HPI.   Physical Exam: Blood pressure 125/81, pulse (!) 58, temperature 98.3 F (36.8 C), temperature source Oral, resp. rate 16, height 4\' 11"  (1.499 m), weight 65.8 kg, SpO2 98 %. General: well appearing, no acute distress  Eyes: non icteric, conjunctiva are well perfused  Cardiac: regular rate and rhythm, no murmurs appreciated, no rubs or gallops, no JVD, trace bl lower ext edema  Pulm: normal work of breathing, lungs clear to auscult GI: abdomen is soft, non tender, non distended  Psych: normal affect, conversational   EKG: personally reviewed my interpretation is AV node bradycardia vs complete heart block   CXR: personally reviewed my interpretation is no consolidation or pleural effusion   Assessment & Plan by Problem: Active Problems:   Bradycardia  Bradycardia  Chest pain  This is a 72 year old woman with history of hypertrophic cardiomyopathy with diastolic dysfunction, hypertension, hld, insulin dependent. Initial troponin 0.01, EKG was concerning for AV node conducting or third degree AV block however with the spontaneous resolution to sinus bradycardia heart block is less likely. The etiology of this arrhythmia and symptoms could be related to her nodal blocking medications, hypoglycemia, or most condemningly RCA ischemic. HEART score is 5 (moderately suspicious features of chest pain, age 79>65, hx htn, hld, dm) putting her at moderate risk for these symptoms representing ACS. We will hold her home metoprolol succinate and diltiazem and monitor closely for improvement of  her rate before resolution. I have called cardiology and placed her on telemetry monitoring. I believe cardiology should be aware of this case in case she does develop the arrhythmia again.  - telemetry monitoring  - trend troponins, aspirin, repeat EKG  - TSH, magnesium, phosphorous, CBG monitoring  - I spoke with [name] again when she arrived on the floor and her chest pain had resolved without intervention. If she were to develop chest pain again IV morphine or a Non-Dihydropyridine calcium channel blocker could be considered. Nitrates reduce preload and afterload and can aggravate the angina of hypertrophic cardiomyopathy, if she is having symptomatic RCA ischemia they would be unsafe as well   Hypertrophic cardiomyopathy  Managed by cardiology at [facility], she is on CCB and BB for this and was last seen 06/2018.  [facility] showed concentric LVH without obstruction.   Type 2 Diabetes  This is  managed with endocrinology at [facility], she uses sliding scale 70/30 insulin usually taking about 40-50 units twice daily, last A1c 8.2 three months ago. She has a history of hypoglycemia especially at night mentioned in endocrinology notes and symptoms and signs as outlined above which may have been related to hypoglycemia. While inpatient we will attempt to adjust her insulin dose to a level that is going to cause less hypoglycemia.   CKD 3b Crt 1.59 on admission up from 1.46 when last checked four months ago in care everywhere. Will provide a small fluid bolus.  - Follow up BMP tomorrow morning   Microcytic anemia with increased RDW  Hgb 11.4, this is consistent with her baseline. She has a low MCV and elevated RDW.  - follow up ferritin, if this is iron deficiency she will need further workup to find the source    Dispo: Admit patient to Observation with expected length of stay less than 2 midnights.  Signed: Eulah Pont, MD 08/23/2018, 9:07 AM  Pager: [phone]

## 2018-08-23 NOTE — ED Notes (Signed)
Pt noted to have 5 beats of vtach; [facility]
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
## 2018-08-24 ENCOUNTER — Other Ambulatory Visit: Payer: Self-pay | Admitting: Cardiology

## 2018-08-24 DIAGNOSIS — R079 Chest pain, unspecified: Secondary | ICD-10-CM

## 2018-08-24 DIAGNOSIS — I422 Other hypertrophic cardiomyopathy: Secondary | ICD-10-CM | POA: Diagnosis not present

## 2018-08-24 DIAGNOSIS — I421 Obstructive hypertrophic cardiomyopathy: Secondary | ICD-10-CM | POA: Diagnosis not present

## 2018-08-24 DIAGNOSIS — I129 Hypertensive chronic kidney disease with stage 1 through stage 4 chronic kidney disease, or unspecified chronic kidney disease: Secondary | ICD-10-CM | POA: Diagnosis not present

## 2018-08-24 DIAGNOSIS — N183 Chronic kidney disease, stage 3 (moderate): Secondary | ICD-10-CM | POA: Diagnosis not present

## 2018-08-24 DIAGNOSIS — E1122 Type 2 diabetes mellitus with diabetic chronic kidney disease: Secondary | ICD-10-CM | POA: Diagnosis not present

## 2018-08-24 DIAGNOSIS — R001 Bradycardia, unspecified: Secondary | ICD-10-CM | POA: Diagnosis not present

## 2018-08-24 DIAGNOSIS — R011 Cardiac murmur, unspecified: Secondary | ICD-10-CM

## 2018-08-24 DIAGNOSIS — R002 Palpitations: Secondary | ICD-10-CM

## 2018-08-24 LAB — BASIC METABOLIC PANEL
Anion gap: 9 (ref 5–15)
BUN: 25 mg/dL — ABNORMAL HIGH (ref 8–23)
CALCIUM: 8.7 mg/dL — AB (ref 8.9–10.3)
CO2: 20 mmol/L — ABNORMAL LOW (ref 22–32)
Chloride: 109 mmol/L (ref 98–111)
Creatinine, Ser: 1.45 mg/dL — ABNORMAL HIGH (ref 0.44–1.00)
GFR calc Af Amer: 42 mL/min — ABNORMAL LOW (ref >60)
GFR calc non Af Amer: 36 mL/min — ABNORMAL LOW (ref >60)
Glucose, Bld: 184 mg/dL — ABNORMAL HIGH (ref 70–99)
Potassium: 3.9 mmol/L (ref 3.5–5.1)
Sodium: 138 mmol/L (ref 135–145)

## 2018-08-24 LAB — GLUCOSE, CAPILLARY
Glucose-Capillary: 168 mg/dL — ABNORMAL HIGH (ref 70–99)
Glucose-Capillary: 203 mg/dL — ABNORMAL HIGH (ref 70–99)

## 2018-08-24 MED ORDER — LOSARTAN POTASSIUM 100 MG PO TABS: 100.0000 mg | ORAL_TABLET | Freq: Every day | ORAL | 0 refills | Status: DC

## 2018-08-24 MED ORDER — SODIUM CHLORIDE 0.9 % IV SOLN
INTRAVENOUS | Status: DC
  Administered 2018-08-24: 09:00:00 via INTRAVENOUS

## 2018-08-24 MED ORDER — LIRAGLUTIDE 18 MG/3ML ~~LOC~~ SOPN: 1.8000 mg | PEN_INJECTOR | Freq: Every day | SUBCUTANEOUS | Status: AC

## 2018-08-24 NOTE — Discharge Summary (Signed)
Name: Emily Mathews MRN: [id number] DOB: 1946/05/06 72 y.o. PCP: Loyal Jacobson, MD  Date of Admission: 08/23/2018  4:50 AM Date of Discharge: 08/24/2018 Attending Physician: Tyson Alias, *  Discharge Diagnosis: 1. Symptomatic Bradycardia 2. Chronic Hypertrophic Cardiomyopathy 3. Type 2 Diabetes 4. CKD Stage 3 5. Hypertension  Discharge Medications: Allergies as of 08/24/2018      Reactions   Celebrex [celecoxib] Rash   Penicillins Rash      Medication List    STOP taking these medications   diltiazem 240 MG 24 hr capsule Commonly known as:  CARDIZEM CD   losartan-hydrochlorothiazide 100-25 MG tablet Commonly known as:  HYZAAR   metoprolol succinate 50 MG 24 hr tablet Commonly known as:  TOPROL-XL   metoprolol tartrate 50 MG tablet Commonly known as:  LOPRESSOR   telmisartan-hydrochlorothiazide 80-25 MG tablet Commonly known as:  MICARDIS HCT     TAKE these medications   alendronate 70 MG tablet Commonly known as:  FOSAMAX Take 70 mg by mouth once a week. Take with a full glass of water on an empty stomach.   aspirin EC 81 MG tablet Take 81 mg by mouth daily.   calcium carbonate 1250 (500 Ca) MG tablet Commonly known as:  OS-CAL - dosed in mg of elemental calcium Take 1 tablet by mouth daily.   cholecalciferol 25 MCG (1000 UT) tablet Commonly known as:  VITAMIN D3 Take 2,000 Units by mouth daily.   clindamycin 2 % vaginal cream Commonly known as:  CLEOCIN Place 1 Applicatorful vaginally at bedtime.   insulin aspart protamine- aspart (70-30) 100 UNIT/ML injection Commonly known as:  NOVOLOG MIX 70/30 Inject 0-50 Units into the skin 3 (three) times daily with meals.   liraglutide 18 MG/3ML Sopn Commonly known as:  VICTOZA Inject 0.3 mLs (1.8 mg total) into the skin daily. What changed:  how much to take   losartan 100 MG tablet Commonly known as:  COZAAR Take 1 tablet (100 mg total) by mouth daily.   pantoprazole 40 MG  tablet Commonly known as:  PROTONIX Take 20 mg by mouth daily.   polyethylene glycol packet Commonly known as:  MIRALAX / GLYCOLAX Take 17 g by mouth 2 (two) times daily.   rosuvastatin 40 MG tablet Commonly known as:  CRESTOR Take 40 mg by mouth daily.   traMADol 50 MG tablet Commonly known as:  ULTRAM Take 1 tablet (50 mg total) by mouth every 6 (six) hours as needed.       Disposition and follow-up:   [name] was discharged from [facility] in Stable condition.  At the hospital follow up visit please address:  1.  Please follow-up symptoms of significant palpitations, dizziness, and lightheadedness.  2.  Labs / imaging needed at time of follow-up: BMP  3.  Pending labs/ test needing follow-up: None  Follow-up Appointments: Follow-up Information    [facility] Follow up.   Specialty:  Cardiology Why:  the office will call to arrange a monitor for you to wear and a stress test.   Contact information: [address] [place] [address] [phone]       Parke Poisson, MD Follow up.   Specialty:  Cardiology Why:  the office will call to give you appt with Dr. Jacques Navy.   if you have not heard by Tuesday call the office.   Contact information: [address] [place] [address] [phone]  Hospital Course by problem list: 1. Symptomatic Bradycardia: Ms. Emily Mathews presented with palpitations, chest tightness, and lightheadedness. Initial EKG demonstrated a slow sinus rhythm with escape junctional ventricular beats. Troponins negative. She  remained hemodynamically stable and di not require atropine. Her home AV nodal blockers diltiazem and metoprolol were stopped and she was given IV fluids. Her heart rate improved and she was in normal sinus rhythm on repeat EKG. ECHO during admission showed similar findings to previous ECHO including signs consistent with hypertrophic  obstructive cardiomyopathy. Cardiology consulted and recommended against resuming her diltiazem and metoprolol until her outpatient cardiology clinic appointment. She was discharged in stable condition.  2. Chronic Hypertrophic Cardiomyopathy:  ECHO showed similar findings to previous ECHO including signs consistent with hypertrophic obstructive cardiomyopathy. She will follow-up with cardiology as an outpatient.  3. Type 2 Diabetes: Ms. Emily Mathews checked her blood sugar when she was having palpitations at home and was found to be hypoglycemic. She uses sliding scale 70/30 insulin usually taking between 40-50 units twice daily. Last A1c 8.2 three months ago. She has a history of hypoglycemia especially at night. Her hospital insulin was titrated up to 12 units with CBGs within acceptable limits. She was discharged on her home regiment.   4. CKD Stage 3: Creatinine was elevate upon presentation (1.46->1.59 within 4 months). She was treated with IVF's with improvement in her creatinine. She will need a repeat BMP at hospital follow-up.  5. Hypertension: Her home losartan-hydrochlorothiazide was held throughout admission with blood pressures WNL. Upon discharge she was restarted on this medication.  Discharge Vitals:   BP (!) 152/85 (BP Location: Left Arm)   Pulse 76   Temp 98.7 F (37.1 C) (Oral)   Resp 16   Ht 4\' 11"  (1.499 m)   Wt 65.8 kg   SpO2 97%   BMI 29.29 kg/m   Pertinent Labs, Studies, and Procedures:  BMP Latest Ref Rng & Units 08/24/2018 08/23/2018 12/30/2008  Glucose 70 - 99 mg/dL 161(W184(H) 960(A165(H) 540(J248(H)  BUN 8 - 23 mg/dL 81(X25(H) 91(Y29(H) 78(G31(H)  Creatinine 0.44 - 1.00 mg/dL 9.56(O1.45(H) 1.30(Q1.59(H) 6.57(Q1.41(H)  Sodium 135 - 145 mmol/L 138 139 136  Potassium 3.5 - 5.1 mmol/L 3.9 4.1 4.2  Chloride 98 - 111 mmol/L 109 108 105  CO2 22 - 32 mmol/L 20(L) 18(L) 22  Calcium 8.9 - 10.3 mg/dL 4.6(N8.7(L) 9.3 8.8   62/912/5 ECHO: Study Conclusions - Left ventricle: The cavity size was severely reduced. There was    severe myocardial hypertrophy of the apical and mid left   ventricular segments. Systolic function was vigorous. The   estimated ejection fraction was in the range of 65% to 70%. Wall   motion was normal; there were no regional wall motion   abnormalities. Doppler parameters are consistent with abnormal   left ventricular relaxation (grade 1 diastolic dysfunction).   Doppler parameters are consistent with elevated ventricular   end-diastolic filling pressure. - Aortic valve: There was no regurgitation. - Mitral valve: There was mild regurgitation. - Left atrium: The atrium was moderately dilated. - Right atrium: The atrium was mildly dilated. - Tricuspid valve: There was trivial regurgitation. - Pulmonary arteries: Systolic pressure was within the normal   range. - Inferior vena cava: The vessel was normal in size. - Pericardium, extracardiac: There was no pericardial effusion. Impressions: - There was severe myocardial hypertrophy of the apical and mid   left ventricular segments. LV cavity was small. There is a   mid-cavitary gradient of 42 mmHg. IVC is  small and collapsing.   These findings are consistent with apical form of an obstructive   hypetrophic cardiomyopathy. The patient appears dehydrated.  Discharge Instructions: Discharge Instructions    Diet - low sodium heart healthy   Complete by:  As directed    Discharge instructions   Complete by:  As directed    Thank you for allowing [place] to care for you during your admission.  You were found to have very low heart rate due to taking your metoprolol and diltiazem medications (heart meds).  Please stop taking both of these medications until you are able to meet with the cardiologist at your follow-up clinic appointment.  If you have significant palpitations, significant dizziness/ lightheadedness that you are concerned about please present to the emergency department.  Otherwise you can call the cardiology office for further  instructions or make an appointment sooner.   Increase activity slowly   Complete by:  As directed       Signed: Synetta Shadow, MD 08/24/2018, 1:35 PM   Pager: [phone]

## 2018-08-24 NOTE — Care Management Obs Status (Signed)
MEDICARE OBSERVATION STATUS NOTIFICATION   Patient Details  Name: Emily Mathews MRN: [id number] Date of Birth: [birth year]   Medicare Observation Status Notification Given:  Yes    Colleen CanNatalie Kaio Kuhlman RN, BSN, NCM-BC, ACM-RN [phone] 08/24/2018, 2:05 PM

## 2018-08-24 NOTE — Progress Notes (Signed)
Progress Note  Patient Name: Emily Mathews Date of Encounter: 08/24/2018  Primary Cardiologist: No primary care provider on file.   Subjective   Feels well and is ready to go home.  No additional bradycardia on telemetry.  Inpatient Medications    Scheduled Meds: . aspirin EC  81 mg Oral Daily  . enoxaparin (LOVENOX) injection  30 mg Subcutaneous Q24H  . insulin aspart protamine- aspart  12 Units Subcutaneous BID WC  . pantoprazole  20 mg Oral Daily  . rosuvastatin  40 mg Oral Daily   Continuous Infusions: . sodium chloride 100 mL/hr at 08/24/18 0841   PRN Meds:    Vital Signs    Vitals:   08/23/18 1417 08/23/18 2333 08/24/18 0546 08/24/18 1200  BP: 132/87 126/65 129/78 (!) 152/85  Pulse: 86 78 72 76  Resp: 16 16 16 16   Temp: 98.2 F (36.8 C) 98 F (36.7 C) 98.1 F (36.7 C) 98.7 F (37.1 C)  TempSrc: Oral Oral Oral Oral  SpO2: 98% 98% 99% 97%  Weight:      Height:        Intake/Output Summary (Last 24 hours) at 08/24/2018 1237 Last data filed at 08/23/2018 1500 Gross per 24 hour  Intake 166.42 ml  Output -  Net 166.42 ml   Filed Weights   08/23/18 0450 08/23/18 0453  Weight: 65.8 kg 65.8 kg    Telemetry    Normal sinus rhythm with 2 PVCs- Personally Reviewed There is only 1 telemetry strip to review, on speaking with her nurse she had had no bradycardia overnight ECG    No new- Personally Reviewed  Physical Exam   GEN: No acute distress.   Neck: No JVD Cardiac: regular rhythm, normal rate.  There is a 1-2 out of 6 systolic ejection quality murmur over the left sternal border. Respiratory: Clear to auscultation bilaterally. GI: Soft, nontender, non-distended  MS: No edema; No deformity. Neuro:  Nonfocal  Psych: Normal affect   Labs    Chemistry Recent Labs  Lab 08/23/18 0501 08/24/18 0435  NA 139 138  K 4.1 3.9  CL 108 109  CO2 18* 20*  GLUCOSE 165* 184*  BUN 29* 25*  CREATININE 1.59* 1.45*  CALCIUM 9.3 8.7*  GFRNONAA 32*  36*  GFRAA 37* 42*  ANIONGAP 13 9     Hematology Recent Labs  Lab 08/23/18 0501  WBC 7.3  RBC 5.36*  HGB 11.4*  HCT 41.9  MCV 78.2*  MCH 21.3*  MCHC 27.2*  RDW 16.1*  PLT 231    Cardiac Enzymes Recent Labs  Lab 08/23/18 1110 08/23/18 1257 08/23/18 1905  TROPONINI QUANTITY NOT SUFFICIENT, UNABLE TO PERFORM TEST <0.03 <0.03    Recent Labs  Lab 08/23/18 0516  TROPIPOC 0.01     BNPNo results for input(s): BNP, PROBNP in the last 168 hours.   DDimer No results for input(s): DDIMER in the last 168 hours.   Radiology    Dg Chest 2 View  Result Date: 08/23/2018 CLINICAL DATA:  72 year old female with chest pain. EXAM: CHEST - 2 VIEW COMPARISON:  Chest radiograph dated 07/25/2016 FINDINGS: The heart size and mediastinal contours are within normal limits. Both lungs are clear. The visualized skeletal structures are unremarkable. IMPRESSION: No active cardiopulmonary disease. Electronically Signed   By: Elgie Collard M.D.   On: 08/23/2018 05:32    Cardiac Studies   Study Conclusions  - Left ventricle: The cavity size was severely reduced. There was   severe myocardial  hypertrophy of the apical and mid left   ventricular segments. Systolic function was vigorous. The   estimated ejection fraction was in the range of 65% to 70%. Wall   motion was normal; there were no regional wall motion   abnormalities. Doppler parameters are consistent with abnormal   left ventricular relaxation (grade 1 diastolic dysfunction).   Doppler parameters are consistent with elevated ventricular   end-diastolic filling pressure. - Aortic valve: There was no regurgitation. - Mitral valve: There was mild regurgitation. - Left atrium: The atrium was moderately dilated. - Right atrium: The atrium was mildly dilated. - Tricuspid valve: There was trivial regurgitation. - Pulmonary arteries: Systolic pressure was within the normal   range. - Inferior vena cava: The vessel was normal in  size. - Pericardium, extracardiac: There was no pericardial effusion.  Impressions:  - There was severe myocardial hypertrophy of the apical and mid   left ventricular segments. LV cavity was small. There is a   mid-cavitary gradient of 42 mmHg. IVC is small and collapsing.   These findings are consistent with apical form of an obstructive   hypetrophic cardiomyopathy. The patient appears dehydrated.  Patient Profile     Emily Mathews is a 72 y.o. female with a hx of hypertrophic cardiomyopathywith diastolic dysfunction [facility] hypertension, hyperlipidemia, diabetes requiring insulinwith hypoglycemia,microcyticanemia, GERD, hx of hypomagnesemia, vitamin D deficiency who is being seen today for the evaluation of palpitations at the request of Dr. Obie DredgeBlum.  Assessment & Plan   Active Problems:   Bradycardia   Hypertrophic obstructive cardiomyopathy (HCC)   She has demonstrated no additional bradycardia on telemetry.  Her telemetry reveals 2 PVCs (couplet).  There are no other telemetry strips available for review suggesting unremarkable overnight rhythm.  I would like to further investigate her sense of palpitations and bradycardia with an event monitor, to ensure that she is not having malignant arrhythmia with her history of hypertrophic cardiomyopathy.  Additionally I would like to ensure that stopping her AV nodal blocking agents has resolved the bradycardia.  Finally to better assess her exertional symptoms I will plan for an exercise treadmill test as an outpatient to both monitor her rhythm as well as determine her functional capacity with HCM.  On independent review of her serial echocardiograms, it appears that she has likely reverse curve subtype of hypertrophic cardiomyopathy.  There is no systolic anterior motion of the mitral valve with no obvious hypertrophy at the basal septum.  I would like to obtain a cardiac MRI in the near  future to better delineate the pattern of her myocardial hypertrophy, and to monitor for apical aneurysm given that she has a mid cavitary gradient.  We will arrange this after speaking at our outpatient appointment, when testing is complete.  I have independently reviewed her medication bottles that she has in her purse and instructed the patient to stop her metoprolol succinate 50 mg daily and her diltiazem to 240 mg daily until we speak again.  However I have informed her that if she has significant palpitations, significant dizziness lightheadedness that she should present to the emergency department if concerned, otherwise she should call our office for further instructions and I would be happy to assist and see her sooner.  Ideally I would like for her to take a low-dose of beta-blocker but since she is only on 50 mg daily of metoprolol succinate, we will hold for now and consider adding back a small dose at our  follow-up.  Hypertrophic cardiomyopathy with obstruction requires adequate hydration, and I have emphasized this to the patient and she demonstrated understanding.  This will significantly help with her symptoms, especially because her echocardiogram showed a low central venous pressure and likely dehydration.  [facility] will sign off.   Medication Recommendations: Hold metoprolol succinate and diltiazem. Other recommendations (labs, testing, etc): Event monitor, exercise treadmill test Follow up as an outpatient: Follow-up with Dr. Jacques Navy in 2 to 3 weeks after testing is complete for medication discussion and discussion of results.  For questions or updates, please contact [facility] Please consult [email or web address] for contact info under       Signed, Parke Poisson, MD  08/24/2018, 12:37 PM

## 2018-08-24 NOTE — Progress Notes (Signed)
   Subjective:   Emily Mathews was examined at bedside and denies any new complains.   Reports that about a month ago she was working 2 jobs and has retired from one of her jobs. She asked it it is possible that her symptoms were preceded by her being less active after retirement. We discussed all of her concerns and questions.  Objective:  Vital signs in last 24 hours: Vitals:   08/23/18 1006 08/23/18 1417 08/23/18 2333 08/24/18 0546  BP: 139/86 132/87 126/65 129/78  Pulse: 63 86 78 72  Resp: 20 16 16 16   Temp: 98 F (36.7 C) 98.2 F (36.8 C) 98 F (36.7 C) 98.1 F (36.7 C)  TempSrc: Oral Oral Oral Oral  SpO2: 100% 98% 98% 99%  Weight:      Height:       Physical Exam  Constitutional: She appears well-developed and well-nourished.  Cardiovascular: Normal rate and regular rhythm.  Murmur heard. Neurological: She is alert.  Psychiatric: She has a normal mood and affect. Her behavior is normal.  Nursing note and vitals reviewed.   Assessment/Plan:  Active Problems:   Bradycardia   Hypertrophic obstructive cardiomyopathy (HCC)  Emily Mathews is a 72 year old female with chronic hypertrophic cardiomyopathy who was admitted for observation of symptomatic bradycardia.  Initial EKG demonstrated a slow sinus rhythm with escape junctional ventricular beats.  She remained hemodynamically stable and did not require atropine.  Her home AV nodal blockers diltiazem and metoprolol were stopped and she was given IV fluids.  Her heart rate has improved and she is in normal sinus rhythm on repeat EKG today.  She feels much better this morning.  Echo performed yesterday showed similar findings to previous echo including signs consistent with hypertrophic obstructive cardiomyopathy.  Cardiology consulted and recommends against resuming her diltiazem and metoprolol until her outpatient cardiology clinic appointment.  She is medically stable and will be discharged today with close cardiology  follow-up.  Hypertrophic cardiomyopathy 1.  Stop use of metoprolol and diltiazem until cardiology follow-up appointment. 2. We will follow-up with cardiology for further recommendations.  Type 2 diabetes: 1.  Continue 12 units insulin twice daily with meals  CKD 3B: Creatinine improved with IV fluids. 1.  We will need repeat BMP at follow-up visit.  Dispo: Anticipated discharge today.  Emily ShadowPrince, Emily Mathews M, MD 08/24/2018, 6:26 AM Pager: [phone]

## 2018-08-31 ENCOUNTER — Telehealth (HOSPITAL_COMMUNITY): Payer: Self-pay

## 2018-08-31 NOTE — Telephone Encounter (Signed)
Encounter complete. 

## 2018-09-04 ENCOUNTER — Encounter: Payer: Self-pay | Admitting: Cardiology

## 2018-09-05 ENCOUNTER — Ambulatory Visit (HOSPITAL_COMMUNITY)
Admission: RE | Admit: 2018-09-05 | Discharge: 2018-09-05 | Disposition: A | Discharge status: Home / Self Care | Payer: BLUE CROSS/BLUE SHIELD | Source: Ambulatory Visit | Attending: Cardiovascular Disease | Admitting: Cardiovascular Disease

## 2018-09-05 ENCOUNTER — Encounter (HOSPITAL_COMMUNITY): Payer: Self-pay | Admitting: *Deleted

## 2018-09-05 DIAGNOSIS — R079 Chest pain, unspecified: Secondary | ICD-10-CM

## 2018-09-05 NOTE — Progress Notes (Unsigned)
Abnormal ETT was reviewed by Dr. Kelly. Patient was given the ok to be discharged to go home. 

## 2018-09-06 ENCOUNTER — Ambulatory Visit (INDEPENDENT_AMBULATORY_CARE_PROVIDER_SITE_OTHER): Payer: BLUE CROSS/BLUE SHIELD

## 2018-09-06 ENCOUNTER — Other Ambulatory Visit: Payer: Self-pay | Admitting: Cardiology

## 2018-09-06 DIAGNOSIS — R001 Bradycardia, unspecified: Secondary | ICD-10-CM

## 2018-09-06 DIAGNOSIS — R002 Palpitations: Secondary | ICD-10-CM

## 2018-09-06 LAB — EXERCISE TOLERANCE TEST
CSEPED: 3 min
Estimated workload: 5.4 METS
Exercise duration (sec): 41 s
MPHR: 148 {beats}/min
Peak HR: 126 {beats}/min
Percent HR: 85 %
RPE: 19
Rest HR: 82 {beats}/min

## 2018-09-07 ENCOUNTER — Telehealth: Payer: Self-pay | Admitting: *Deleted

## 2018-09-07 NOTE — Telephone Encounter (Signed)
-----   Message from Allayne ButcherBrittainy M Simmons, [place] sent at 09/06/2018  5:14 PM EST ----- Stress test abnormal. She needs imaging study. Keep coronary CTA that is scheduled for 09/25/17. This will help to see if she has any blockages.

## 2018-09-07 NOTE — Telephone Encounter (Signed)
Called pt re: stress test results, left a message for her to call back.

## 2018-09-13 NOTE — Telephone Encounter (Signed)
2nd attempt to reach pt re: stress test results, left a message for pt to call back. 

## 2018-09-21 ENCOUNTER — Telehealth (HOSPITAL_COMMUNITY): Payer: Self-pay | Admitting: Emergency Medicine

## 2018-09-21 NOTE — Telephone Encounter (Signed)
Reaching out to patient to offer assistance regarding upcoming cardiac imaging study; states she is currently driving and cannot talk at this time; name and call back number provided for further questions should they arise Rockwell Alexandria RN Navigator Cardiac Imaging [phone]

## 2018-09-25 ENCOUNTER — Ambulatory Visit (HOSPITAL_COMMUNITY)
Admission: RE | Admit: 2018-09-25 | Discharge: 2018-09-25 | Disposition: A | Discharge status: Home / Self Care | Payer: BLUE CROSS/BLUE SHIELD | Source: Ambulatory Visit | Attending: Cardiology | Admitting: Cardiology

## 2018-09-25 DIAGNOSIS — I422 Other hypertrophic cardiomyopathy: Secondary | ICD-10-CM

## 2018-09-25 MED ORDER — GADOBUTROL 1 MMOL/ML IV SOLN
7.0000 mL | Freq: Once | INTRAVENOUS | Status: AC | PRN
  Administered 2018-09-25: 7 mL via INTRAVENOUS

## 2018-09-26 ENCOUNTER — Emergency Department (HOSPITAL_COMMUNITY)
Admission: EM | Admit: 2018-09-26 | Discharge: 2018-09-26 | Disposition: A | Discharge status: Home / Self Care | Payer: BLUE CROSS/BLUE SHIELD | Attending: Emergency Medicine | Admitting: Emergency Medicine

## 2018-09-26 ENCOUNTER — Encounter (HOSPITAL_COMMUNITY): Payer: Self-pay | Admitting: Emergency Medicine

## 2018-09-26 ENCOUNTER — Other Ambulatory Visit: Payer: Self-pay

## 2018-09-26 ENCOUNTER — Emergency Department (HOSPITAL_COMMUNITY): Payer: BLUE CROSS/BLUE SHIELD

## 2018-09-26 DIAGNOSIS — S161XXA Strain of muscle, fascia and tendon at neck level, initial encounter: Secondary | ICD-10-CM | POA: Diagnosis not present

## 2018-09-26 DIAGNOSIS — Y9389 Activity, other specified: Secondary | ICD-10-CM | POA: Insufficient documentation

## 2018-09-26 DIAGNOSIS — S199XXA Unspecified injury of neck, initial encounter: Secondary | ICD-10-CM | POA: Diagnosis present

## 2018-09-26 DIAGNOSIS — I10 Essential (primary) hypertension: Secondary | ICD-10-CM | POA: Insufficient documentation

## 2018-09-26 DIAGNOSIS — E119 Type 2 diabetes mellitus without complications: Secondary | ICD-10-CM | POA: Diagnosis not present

## 2018-09-26 DIAGNOSIS — Y999 Unspecified external cause status: Secondary | ICD-10-CM | POA: Insufficient documentation

## 2018-09-26 DIAGNOSIS — Y92410 Unspecified street and highway as the place of occurrence of the external cause: Secondary | ICD-10-CM | POA: Insufficient documentation

## 2018-09-26 MED ORDER — CYCLOBENZAPRINE HCL 10 MG PO TABS
5.0000 mg | ORAL_TABLET | Freq: Once | ORAL | Status: AC
  Administered 2018-09-26: 5 mg via ORAL
  Filled 2018-09-26: qty 1

## 2018-09-26 MED ORDER — CYCLOBENZAPRINE HCL 5 MG PO TABS: 5.0000 mg | ORAL_TABLET | Freq: Three times a day (TID) | ORAL | 0 refills | Status: DC | PRN
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
## 2018-09-26 NOTE — ED Notes (Signed)
Patient verbalizes understanding of discharge instructions. Opportunity for questioning and answers were provided. Armband removed by staff, pt discharged from ED.  
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
## 2018-10-02 ENCOUNTER — Telehealth: Payer: Self-pay

## 2018-10-02 NOTE — Telephone Encounter (Signed)
-----   Message from Parke Poisson, MD sent at 09/28/2018  9:07 AM EST ----- MRI consistent with hypertrophic cardiomyopathy. We will discuss this further at our f/u appt.

## 2018-10-02 NOTE — Telephone Encounter (Signed)
Called to give pt Cardiac MRI results. [email or web address]. 

## 2018-10-11 NOTE — Telephone Encounter (Signed)
-----   Message from Gayatri A Acharya, MD sent at 09/28/2018  9:07 AM EST ----- MRI consistent with hypertrophic cardiomyopathy. We will discuss this further at our f/u appt. 

## 2018-10-11 NOTE — Telephone Encounter (Signed)
Pt aware of Cardiac MRI results with verbalized understanding. Pt is scheduled to see Dr.Acharya on 10/16/18 @ 2:20pm. Confirmed date, time, and location of the appt with the pt.

## 2018-10-16 ENCOUNTER — Ambulatory Visit (INDEPENDENT_AMBULATORY_CARE_PROVIDER_SITE_OTHER): Payer: BLUE CROSS/BLUE SHIELD | Admitting: Internal Medicine

## 2018-10-16 ENCOUNTER — Encounter: Payer: Self-pay | Admitting: Internal Medicine

## 2018-10-16 VITALS — BP 124/64 | HR 61 | Ht 59.0 in | Wt 149.0 lb

## 2018-10-16 DIAGNOSIS — I422 Other hypertrophic cardiomyopathy: Secondary | ICD-10-CM

## 2018-10-16 DIAGNOSIS — R079 Chest pain, unspecified: Secondary | ICD-10-CM | POA: Diagnosis not present

## 2018-10-16 NOTE — H&P (View-Only) (Signed)
Cardiology Office Note:    Date:  10/16/2018   ID:  Emily Mathews, DOB 05/07/1946, MRN [id number]  PCP:  Kalish, Michael, MD  Cardiologist:  No primary care provider on file.  Electrophysiologist:  None   Referring MD: Kalish, Michael, MD   Follow-up hypertrophic cardiomyopathy  History of Present Illness:    Emily Mathews is a 72 y.o. 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  She also has a history of hypertension, hyperlipidemia, diabetes requiring insulin, microcytic anemia, GERD, and palpitations for which she was seen in the hospital recently.  I had seen the patient while in hospital in December and had arranged for a cardiac MRI to ensure we had an accurate record of her scar burden in the setting of palpitations.  MRI showed late gadolinium enhancement in the basal anteroseptum and mid anterior and inferior walls in the area of maximal wall thickness.  There was evidence of mid cavitary gradient.  No evidence of LVOT obstruction.  Area of maximal thickness measured 24 mm.  We also completed extended cardiac monitoring, which showed no sustained ventricular tachycardia, no atrial fibrillation, and no significant arrhythmias.  Of note the patient only wore the monitor for 7 days and not the prescribed 30 days.  Given her symptoms of bradycardia when checking her pulse manually as well as mild chest tightness when she experiences palpitations we completed an exercise tolerance test.  This was primarily to evaluate for exercise-induced arrhythmias, exertional symptoms, and for hypotension with exercise which could portend higher risk mid cavitary obstruction.  Exercise tolerance test demonstrated a blood pressure hypotensive response to exercise, and ST-T wave changes suggestive of ischemia.  No arrhythmia was noted on ETT.  Today she tells me that she does get short of breath with activity, and  notes a sense of chest pressure and tightness with exertion.  In hospital I had recommended that we discontinue her AV nodal blocking agents for short period of time so as to ensure that her bradycardia was not provoked.  However she visited with her primary care physician and felt the recommendation was to continue both metoprolol succinate 50 mg daily and diltiazem 240 mg daily which she is currently taking.  She has no significant PND, orthopnea, or leg swelling.  She denies a history of syncope or presyncope, particularly in the setting of bradycardia.  Denies fevers, chills, cough, nausea, vomiting, change in bowel habits, or bleeding complications.  Past Medical History:  Diagnosis Date  . Diabetes mellitus without complication (HCC)   . Heart murmur   . Hypertension   . Hypertrophic obstructive cardiomyopathy (HCC)     Past Surgical History:  Procedure Laterality Date  . ABDOMINAL HYSTERECTOMY    . CARDIAC CATHETERIZATION  12/29/2008   normal L main, LAD free of siease, Cfx free of disease, normal RCA, hypertrophic cardiomyopathy with 60mm subvalvular to apex gradient (Dr. A. Little)  . CESAREAN SECTION    . TRANSTHORACIC ECHOCARDIOGRAM  09/2012   EF 65-70%, severe septal hypertrophy, grade 1 diastolic dysfunction; LA in upper limites of normal in size    Current Medications: Current Meds  Medication Sig  . alendronate (FOSAMAX) 70 MG tablet Take 70 mg by mouth once a week. Take with a full glass of water on an empty stomach.  . aspirin EC 81 MG tablet Take 81 mg by mouth daily.  . cholecalciferol (VITAMIN D3) 25 MCG (1000 UT) tablet   Take 2,000 Units by mouth daily.  . diltiazem (DILACOR XR) 240 MG 24 hr capsule Take 240 mg by mouth daily.  . insulin aspart protamine- aspart (NOVOLOG MIX 70/30) (70-30) 100 UNIT/ML injection Inject 0-50 Units into the skin 3 (three) times daily with meals.  . liraglutide (VICTOZA) 18 MG/3ML SOPN Inject 0.3 mLs (1.8 mg total) into the skin daily.  .  metoprolol succinate (TOPROL-XL) 50 MG 24 hr tablet Take 50 mg by mouth daily.  . pantoprazole (PROTONIX) 40 MG tablet Take 20 mg by mouth daily.   . rosuvastatin (CRESTOR) 40 MG tablet Take 40 mg by mouth daily.  . telmisartan-hydrochlorothiazide (MICARDIS HCT) 80-25 MG tablet Take 1 tablet by mouth daily.     Allergies:   Celebrex [celecoxib] and Penicillins   Social History   Socioeconomic History  . Marital status: Legally Separated    Spouse name: Not on file  . Number of children: 5  . Years of education: 12  . Highest education level: Not on file  Occupational History  . Occupation: CNA    Employer: [employer]  Social Needs  . Financial resource strain: Not on file  . Food insecurity:    Worry: Not on file    Inability: Not on file  . Transportation needs:    Medical: Not on file    Non-medical: Not on file  Tobacco Use  . Smoking status: Never Smoker  . Smokeless tobacco: Never Used  Substance and Sexual Activity  . Alcohol use: Yes    Comment: Occas  . Drug use: No  . Sexual activity: Not Currently    Comment: 1st intercourse 20 yo-5 partners  Lifestyle  . Physical activity:    Days per week: Not on file    Minutes per session: Not on file  . Stress: Not on file  Relationships  . Social connections:    Talks on phone: Not on file    Gets together: Not on file    Attends religious service: Not on file    Active member of club or organization: Not on file    Attends meetings of clubs or organizations: Not on file    Relationship status: Not on file  Other Topics Concern  . Not on file  Social History Narrative  . Not on file     Family History: The patient's family history includes Diabetes in her mother and sister; Heart disease in her mother; Hyperlipidemia in her mother and sister; Hypertension in her mother and sister; Stroke in her mother and sister.  ROS:   Please see the history of present illness.    All other systems reviewed and are  negative.  EKGs/Labs/Other Studies Reviewed:    The following studies were reviewed today:  EKG: Not performed today  Recent Labs: 08/23/2018: Hemoglobin 11.4; Magnesium 1.8; Platelets 231; TSH 0.890 08/24/2018: BUN 25; Creatinine, Ser 1.45; Potassium 3.9; Sodium 138  Recent Lipid Panel No results found for: CHOL, TRIG, HDL, CHOLHDL, VLDL, LDLCALC, LDLDIRECT  Physical Exam:    VS:  BP 124/64   Pulse 61   Ht 4' 11" (1.499 m)   Wt 149 lb (67.6 kg)   BMI 30.09 kg/m     Wt Readings from Last 3 Encounters:  10/16/18 149 lb (67.6 kg)  09/26/18 150 lb (68 kg)  08/23/18 145 lb (65.8 kg)     Constitutional: No acute distress Eyes: pupils equally round and reactive to light, sclera non-icteric, normal conjunctiva and lids ENMT: normal   dentition, moist mucous membranes Cardiovascular: regular rhythm, normal rate,. S1 and S2 normal.  2 out of 6 systolic ejection quality murmur over the left sternal border, no clear change with provocative maneuvers.  Radial pulses normal bilaterally. No jugular venous distention.  Respiratory: clear to auscultation bilaterally GI : normal bowel sounds, soft and nontender. No distention.   MSK: extremities warm, well perfused. No edema.  NEURO: grossly nonfocal exam, moves all extremities. PSYCH: alert and oriented x 3, normal mood and affect.      ASSESSMENT:    1. Chest pain, unspecified type   2. Hypertrophic cardiomyopathy (HCC)    PLAN:    She is experiencing symptoms which I am concerned may represent ischemia, and her exercise treadmill test had an abnormal ECG.  She does experience shortness of breath with exertion which certainly may be related to reverse curve hypertrophic cardiomyopathy with a mid cavitary gradient most recently documented on echo to be a dynamic gradient of 42 mmHg.  Her echo also showed hyperdynamic LV function and a small collapsible IVC, suggesting LV was underfilled.  However with her typical risk factors of  hypertension, hyperlipidemia, and diabetes I cannot completely exclude the possibility of obstructive coronary artery disease as the source of her chest discomfort and shortness of breath.  I will arrange for coronary angiogram.  I have discussed this in detail with the patient and we have participated in shared decision making.  We concluded that a coronary angiogram to assess for obstructive coronary artery disease would be advisable.  We discussed possible need for PCI and implications of dual antiplatelet therapy.  For now it would be reasonable to continue on metoprolol succinate 50 mg daily.  She is also taking diltiazem 240 mg daily.  I would prefer an alternate antihypertensive without AV nodal blocking properties, however the patient is comfortable with her medication regimen at this time and her medications were recently reviewed by her primary care physician who she has an excellent working relationship with.  It is reasonable to continue these through coronary angiography and make further determinations of best medical therapy with more information.  INFORMED CONSENT: I have reviewed the risks, indications, and alternatives to cardiac catheterization, possible angioplasty, and stenting with the patient. Risks include but are not limited to bleeding, infection, vascular injury, stroke, myocardial infection, arrhythmia, kidney injury, radiation-related injury in the case of prolonged fluoroscopy use, emergency cardiac surgery, and death. The patient understands the risks of serious complication is 1-2 in 1000 with diagnostic cardiac cath and 1-2% or less with angioplasty/stenting.   I have instructed the patient that dual antiplatelet therapy should be taken for 1 year without interruption if indicated after cath.  We have discussed the consequences of interrupted dual antiplatelet therapy and the risk for in-stent thrombosis.    Medication Adjustments/Labs and Tests Ordered: Current medicines  are reviewed at length with the patient today.  Concerns regarding medicines are outlined above.  Orders Placed This Encounter  Procedures  . Basic metabolic panel  . CBC with Differential   No orders of the defined types were placed in this encounter.   Patient Instructions  Medication Instructions:  Your physician recommends that you continue on your current medications as directed. Please refer to the Current Medication list given to you today.  If you need a refill on your cardiac medications before your next appointment, please call your pharmacy.   Lab work: Bmet and Cbc today If you have labs (blood work) drawn today and   your tests are completely normal, you will receive your results only by: . MyChart Message (if you have MyChart) OR . A paper copy in the mail If you have any lab test that is abnormal or we need to change your treatment, we will call you to review the results.  Testing/Procedures: Your physician has requested that you have a cardiac catheterization. Cardiac catheterization is used to diagnose and/or treat various heart conditions. Doctors may recommend this procedure for a number of different reasons. The most common reason is to evaluate chest pain. Chest pain can be a symptom of coronary artery disease (CAD), and cardiac catheterization can show whether plaque is narrowing or blocking your heart's arteries. This procedure is also used to evaluate the valves, as well as measure the blood flow and oxygen levels in different parts of your heart. For further information please visit www.cardiosmart.org. Please follow instruction sheet, as given.    Follow-Up: At [facility], you and your health needs are our priority.  As part of our continuing mission to provide you with exceptional heart care, we have created designated Provider Care Teams.  These Care Teams include your primary Cardiologist (physician) and Advanced Practice Providers (APPs -  Physician  Assistants and Nurse Practitioners) who all work together to provide you with the care you need, when you need it. You will need a follow up appointment after the procedure     You may see Dr.Acharya or one of the following Advanced Practice Providers on your designated Care Team:   Rhonda Barrett, PA-C . Kathryn Lawrence, DNP, ANP  Any Other Special Instructions Will Be Listed Below (If Applicable).     [facility] [address] [place] [address] Dept: [phone] Loc: [phone]  Emily Mathews  10/16/2018  You are scheduled for a Cardiac Catheterization on Thursday, February 6 with Dr. Christopher End.  1. Please arrive at the [facility] Tower (Main Entrance A) at [facility]: [address] [place], [place] [address] at 7:00 AM (This time is two hours before your procedure to ensure your preparation). Free valet parking service is available.   Special note: Every effort is made to have your procedure done on time. Please understand that emergencies sometimes delay scheduled procedures.  2. Diet: Do not eat solid foods after midnight.  The patient may have clear liquids until 5am upon the day of the procedure.  3. Labs: You will need to have blood drawn today (Bmet, Cbc)  4. Medication instructions in preparation for your procedure:   Contrast Allergy: No   Current Outpatient Medications (Endocrine & Metabolic):  .  alendronate (FOSAMAX) 70 MG tablet, Take 70 mg by mouth once a week. Take with a full glass of water on an empty stomach. .  insulin aspart protamine- aspart (NOVOLOG MIX 70/30) (70-30) 100 UNIT/ML injection, Inject 0-50 Units into the skin 3 (three) times daily with meals. .  liraglutide (VICTOZA) 18 MG/3ML SOPN, Inject 0.3 mLs (1.8 mg total) into the skin daily.  Current Outpatient Medications (Cardiovascular):  .  diltiazem (DILACOR XR) 240 MG 24 hr capsule, Take  240 mg by mouth daily. .  metoprolol succinate (TOPROL-XL) 50 MG 24 hr tablet, Take 50 mg by mouth daily. .  rosuvastatin (CRESTOR) 40 MG tablet, Take 40 mg by mouth daily. .  telmisartan-hydrochlorothiazide (MICARDIS HCT) 80-25 MG tablet, Take 1 tablet by mouth daily.   Current Outpatient Medications (Analgesics):  .  aspirin   EC 81 MG tablet, Take 81 mg by mouth daily.   Current Outpatient Medications (Other):  .  cholecalciferol (VITAMIN D3) 25 MCG (1000 UT) tablet, Take 2,000 Units by mouth daily. .  pantoprazole (PROTONIX) 40 MG tablet, Take 20 mg by mouth daily.     Do not take your Micardis HCT the morning of the procedure  Take only 25 units of insulin the night before your procedure. Do not take any insulin on the day of the procedure.    On the morning of your procedure, take your Aspirin and any morning medicines NOT listed above.  You may use sips of water.  5. Plan for one night stay--bring personal belongings. 6. Bring a current list of your medications and current insurance cards. 7. You MUST have a responsible person to drive you home. 8. Someone MUST be with you the first 24 hours after you arrive home or your discharge will be delayed. 9. Please wear clothes that are easy to get on and off and wear slip-on shoes.  Thank you for allowing us to care for you!   -- [facility]       Signed, Gayatri A Acharya, MD  10/16/2018 5:35 PM    [facility] 

## 2018-10-16 NOTE — Progress Notes (Signed)
Cardiology Office Note:    Date:  10/16/2018   ID:  Emily Mathews, DOB 1946-08-11, MRN [id number]  PCP:  Kalish, Michael, MD  Cardiologist:  No primary care provider on file.  Electrophysiologist:  None   Referring MD: Kalish, Michael, MD   Follow-up hypertrophic cardiomyopathy  History of Present Illness:    Emily Mathews is a 73 y.o. 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  She also has a history of hypertension, hyperlipidemia, diabetes requiring insulin, microcytic anemia, GERD, and palpitations for which she was seen in the hospital recently.  I had seen the patient while in hospital in December and had arranged for a cardiac MRI to ensure we had an accurate record of her scar burden in the setting of palpitations.  MRI showed late gadolinium enhancement in the basal anteroseptum and mid anterior and inferior walls in the area of maximal wall thickness.  There was evidence of mid cavitary gradient.  No evidence of LVOT obstruction.  Area of maximal thickness measured 24 mm.  We also completed extended cardiac monitoring, which showed no sustained ventricular tachycardia, no atrial fibrillation, and no significant arrhythmias.  Of note the patient only wore the monitor for 7 days and not the prescribed 30 days.  Given her symptoms of bradycardia when checking her pulse manually as well as mild chest tightness when she experiences palpitations we completed an exercise tolerance test.  This was primarily to evaluate for exercise-induced arrhythmias, exertional symptoms, and for hypotension with exercise which could portend higher risk mid cavitary obstruction.  Exercise tolerance test demonstrated a blood pressure hypotensive response to exercise, and ST-T wave changes suggestive of ischemia.  No arrhythmia was noted on ETT.  Today she tells me that she does get short of breath with activity, and  notes a sense of chest pressure and tightness with exertion.  In hospital I had recommended that we discontinue her AV nodal blocking agents for short period of time so as to ensure that her bradycardia was not provoked.  However she visited with her primary care physician and felt the recommendation was to continue both metoprolol succinate 50 mg daily and diltiazem 240 mg daily which she is currently taking.  She has no significant PND, orthopnea, or leg swelling.  She denies a history of syncope or presyncope, particularly in the setting of bradycardia.  Denies fevers, chills, cough, nausea, vomiting, change in bowel habits, or bleeding complications.  Past Medical History:  Diagnosis Date  . Diabetes mellitus without complication (HCC)   . Heart murmur   . Hypertension   . Hypertrophic obstructive cardiomyopathy (HCC)     Past Surgical History:  Procedure Laterality Date  . ABDOMINAL HYSTERECTOMY    . CARDIAC CATHETERIZATION  12/29/2008   normal L main, LAD free of siease, Cfx free of disease, normal RCA, hypertrophic cardiomyopathy with 78mmWest Palm Beach Va MeSt Luke'S Baptist 7859 PArMarland KitchenHardie Pul66meySaint Thomas Stones RiPromise Hospital Of East Los Angeles-East L.A5 E. ArMarland KitchenHardie Pul80meyShriners Hospitals For Children Portneuf 4ArMarland KitchenHardie Pul15meySt Vincent [facility] 2ArMarland KitchenHardie Pul75meyShreveport EndoBerkshire Cosmetic And Reconstructive Surgery Cen905ArMarland KitchenHardie Pul33meyAs[place] SpeciaPlatinum Surger463 MArMarland KitchenHardie Pul29meyBeverly Hills Doctor SurUpmc Hamot SurgeryArMarland KitchenHardie Pul69meyKaiser Fnd HoMagnolia Endoscopy Ce7331 NW. BluArMarland KitchenHardie Pul2meyOhio CouYellowstone Surgery Cen14 SArMarland KitchenHardie Pul83meyAdvanced Surgery Center Of Our [facility] Medica7694 HarArMarland KitchenHardie Pul37meyBarbourville Richla190 WhiArMarland KitchenHardie Pul44meyHudson Bergen MeLahaye Center For Advanced Eye Care Of Lafay812 WestArMarland KitchenHardie Pul53meySaint Anthony MeMorledge Family Surger37 ArMarland KitchenHardie Pul55meyGreater Ny Endoscopy SurSaint Barnabas Medical 589 StArMarland KitchenHardie Pul64meyCh Ambulatory Surgery Center Of LFranciscan Health Michig373 RivArMarland KitchenHardie Pul92meySpecialty Surgery Va [facility] System - Per968 E.ArMarland KitchenHardie Pul42meyBoynton BEncompass Health New England Rehabiliation At7669 GleArMarland KitchenHardie Pul26meyPhysicians Choice SurEmerald Surgical Ce7599 South WesArMarland KitchenHardie Pul36meyRed LWk Bossier Health7160 WiArMarland KitchenHardie Pul40meyMagnolia SuSalem Memorial District 55 CaArMarland KitchenHardie Pul44meyNational Park Endoscopy Center LLC Dba South CentrOrlando Health Dr P Phillips 73ArMarland KitchenHardie Pul67meyCornerstone Ambulatory SurgerLafayette-Amg Specialty H7486 ArMarland KitchenHardie Pul37meyTulsa Ambulatory ProcedurCarneyArMarland KitchenHardie Pul51meyHealtheast St JoOcean Springs 9212 South ArMarland KitchenHardie Pulleyte Norrisdministrator, Civil Servicey Little)  . CESAREAN SECTION    . TRANSTHORACIC ECHOCARDIOGRAM  09/2012   EF 65-70%, severe septal hypertrophy, grade 1 diastolic dysfunction; LA in upper limites of normal in size    Current Medications: Current Meds  Medication Sig  . alendronate (FOSAMAX) 70 MG tablet Take 70 mg by mouth once a week. Take with a full glass of water on an empty stomach.  . aspirin EC 81 MG tablet Take 81 mg by mouth daily.  . cholecalciferol (VITAMIN D3) 25 MCG (1000 UT) tablet  Take 2,000 Units by mouth daily.  Marland Kitchen. diltiazem (DILACOR XR) 240 MG 24 hr capsule Take 240 mg by mouth daily.  . insulin aspart protamine- aspart (NOVOLOG MIX 70/30) (70-30) 100 UNIT/ML injection Inject 0-50 Units into the skin 3 (three) times daily with meals.  . liraglutide (VICTOZA) 18 MG/3ML SOPN Inject 0.3 mLs (1.8 mg total) into the skin daily.  .  metoprolol succinate (TOPROL-XL) 50 MG 24 hr tablet Take 50 mg by mouth daily.  . pantoprazole (PROTONIX) 40 MG tablet Take 20 mg by mouth daily.   . rosuvastatin (CRESTOR) 40 MG tablet Take 40 mg by mouth daily.  Marland Kitchen. telmisartan-hydrochlorothiazide (MICARDIS HCT) 80-25 MG tablet Take 1 tablet by mouth daily.     Allergies:   Celebrex [celecoxib] and Penicillins   Social History   Socioeconomic History  . Marital status: Legally Separated    Spouse name: Not on file  . Number of children: 5  . Years of education: 4812  . Highest education level: Not on file  Occupational History  . Occupation: Scientist, research (medical)CNA    Employer: [employer]  Social Needs  . Financial resource strain: Not on file  . Food insecurity:    Worry: Not on file    Inability: Not on file  . Transportation needs:    Medical: Not on file    Non-medical: Not on file  Tobacco Use  . Smoking status: Never Smoker  . Smokeless tobacco: Never Used  Substance and Sexual Activity  . Alcohol use: Yes    Comment: Occas  . Drug use: No  . Sexual activity: Not Currently    Comment: 1st intercourse 20 yo-5 partners  Lifestyle  . Physical activity:    Days per week: Not on file    Minutes per session: Not on file  . Stress: Not on file  Relationships  . Social connections:    Talks on phone: Not on file    Gets together: Not on file    Attends religious service: Not on file    Active member of club or organization: Not on file    Attends meetings of clubs or organizations: Not on file    Relationship status: Not on file  Other Topics Concern  . Not on file  Social History Narrative  . Not on file     Family History: The patient's family history includes Diabetes in her mother and sister; Heart disease in her mother; Hyperlipidemia in her mother and sister; Hypertension in her mother and sister; Stroke in her mother and sister.  ROS:   Please see the history of present illness.    All other systems reviewed and are  negative.  EKGs/Labs/Other Studies Reviewed:    The following studies were reviewed today:  EKG: Not performed today  Recent Labs: 08/23/2018: Hemoglobin 11.4; Magnesium 1.8; Platelets 231; TSH 0.890 08/24/2018: BUN 25; Creatinine, Ser 1.45; Potassium 3.9; Sodium 138  Recent Lipid Panel No results found for: CHOL, TRIG, HDL, CHOLHDL, VLDL, LDLCALC, LDLDIRECT  Physical Exam:    VS:  BP 124/64   Pulse 61   Ht 4\' 11"  (1.499 m)   Wt 149 lb (67.6 kg)   BMI 30.09 kg/m     Wt Readings from Last 3 Encounters:  10/16/18 149 lb (67.6 kg)  09/26/18 150 lb (68 kg)  08/23/18 145 lb (65.8 kg)     Constitutional: No acute distress Eyes: pupils equally round and reactive to light, sclera non-icteric, normal conjunctiva and lids ENMT: normal  dentition, moist mucous membranes Cardiovascular: regular rhythm, normal rate,. S1 and S2 normal.  2 out of 6 systolic ejection quality murmur over the left sternal border, no clear change with provocative maneuvers.  Radial pulses normal bilaterally. No jugular venous distention.  Respiratory: clear to auscultation bilaterally GI : normal bowel sounds, soft and nontender. No distention.   MSK: extremities warm, well perfused. No edema.  NEURO: grossly nonfocal exam, moves all extremities. PSYCH: alert and oriented x 3, normal mood and affect.      ASSESSMENT:    1. Chest pain, unspecified type   2. Hypertrophic cardiomyopathy (HCC)    PLAN:    She is experiencing symptoms which I am concerned may represent ischemia, and her exercise treadmill test had an abnormal ECG.  She does experience shortness of breath with exertion which certainly may be related to reverse curve hypertrophic cardiomyopathy with a mid cavitary gradient most recently documented on echo to be a dynamic gradient of 42 mmHg.  Her echo also showed hyperdynamic LV function and a small collapsible IVC, suggesting LV was underfilled.  However with her typical risk factors of  hypertension, hyperlipidemia, and diabetes I cannot completely exclude the possibility of obstructive coronary artery disease as the source of her chest discomfort and shortness of breath.  I will arrange for coronary angiogram.  I have discussed this in detail with the patient and we have participated in shared decision making.  We concluded that a coronary angiogram to assess for obstructive coronary artery disease would be advisable.  We discussed possible need for PCI and implications of dual antiplatelet therapy.  For now it would be reasonable to continue on metoprolol succinate 50 mg daily.  She is also taking diltiazem 240 mg daily.  I would prefer an alternate antihypertensive without AV nodal blocking properties, however the patient is comfortable with her medication regimen at this time and her medications were recently reviewed by her primary care physician who she has an excellent working relationship with.  It is reasonable to continue these through coronary angiography and make further determinations of best medical therapy with more information.  INFORMED CONSENT: I have reviewed the risks, indications, and alternatives to cardiac catheterization, possible angioplasty, and stenting with the patient. Risks include but are not limited to bleeding, infection, vascular injury, stroke, myocardial infection, arrhythmia, kidney injury, radiation-related injury in the case of prolonged fluoroscopy use, emergency cardiac surgery, and death. The patient understands the risks of serious complication is 1-2 in 1000 with diagnostic cardiac cath and 1-2% or less with angioplasty/stenting.   I have instructed the patient that dual antiplatelet therapy should be taken for 1 year without interruption if indicated after cath.  We have discussed the consequences of interrupted dual antiplatelet therapy and the risk for in-stent thrombosis.    Medication Adjustments/Labs and Tests Ordered: Current medicines  are reviewed at length with the patient today.  Concerns regarding medicines are outlined above.  Orders Placed This Encounter  Procedures  . Basic metabolic panel  . CBC with Differential   No orders of the defined types were placed in this encounter.   Patient Instructions  Medication Instructions:  Your physician recommends that you continue on your current medications as directed. Please refer to the Current Medication list given to you today.  If you need a refill on your cardiac medications before your next appointment, please call your pharmacy.   Lab work: Scientist, product/process development today If you have labs (blood work) drawn today and  your tests are completely normal, you will receive your results only by: Marland Kitchen MyChart Message (if you have MyChart) OR . A paper copy in the mail If you have any lab test that is abnormal or we need to change your treatment, we will call you to review the results.  Testing/Procedures: Your physician has requested that you have a cardiac catheterization. Cardiac catheterization is used to diagnose and/or treat various heart conditions. Doctors may recommend this procedure for a number of different reasons. The most common reason is to evaluate chest pain. Chest pain can be a symptom of coronary artery disease (CAD), and cardiac catheterization can show whether plaque is narrowing or blocking your heart's arteries. This procedure is also used to evaluate the valves, as well as measure the blood flow and oxygen levels in different parts of your heart. For further information please visit [email or web address] Please follow instruction sheet, as given.    Follow-Up: At [facility], you and your health needs are our priority.  As part of our continuing mission to provide you with exceptional heart care, we have created designated Provider Care Teams.  These Care Teams include your primary Cardiologist (physician) and Advanced Practice Providers (APPs -  Physician  Assistants and Nurse Practitioners) who all work together to provide you with the care you need, when you need it. You will need a follow up appointment after the procedure     You may see Dr.Mckinzey Entwistle or one of the following Advanced Practice Providers on your designated Care Team:   Theodore Demark, PA-C . Joni Reining, DNP, ANP  Any Other Special Instructions Will Be Listed Below (If Applicable).     [facility] [address] [place] [place] [address] Dept: [phone] Loc: [phone]  Adreana Varner  10/16/2018  You are scheduled for a Cardiac Catheterization on Thursday, February 6 with Dr. Cristal Deer End.  1. Please arrive at the [facility] (Main Entrance A) at [facility]: [address] [place], [place] [address] at 7:00 AM (This time is two hours before your procedure to ensure your preparation). Free valet parking service is available.   Special note: Every effort is made to have your procedure done on time. Please understand that emergencies sometimes delay scheduled procedures.  2. Diet: Do not eat solid foods after midnight.  The patient may have clear liquids until 5am upon the day of the procedure.  3. Labs: You will need to have blood drawn today (Bmet, Cbc)  4. Medication instructions in preparation for your procedure:   Contrast Allergy: No   Current Outpatient Medications (Endocrine & Metabolic):  .  alendronate (FOSAMAX) 70 MG tablet, Take 70 mg by mouth once a week. Take with a full glass of water on an empty stomach. .  insulin aspart protamine- aspart (NOVOLOG MIX 70/30) (70-30) 100 UNIT/ML injection, Inject 0-50 Units into the skin 3 (three) times daily with meals. .  liraglutide (VICTOZA) 18 MG/3ML SOPN, Inject 0.3 mLs (1.8 mg total) into the skin daily.  Current Outpatient Medications (Cardiovascular):  .  diltiazem (DILACOR XR) 240 MG 24 hr capsule, Take  240 mg by mouth daily. .  metoprolol succinate (TOPROL-XL) 50 MG 24 hr tablet, Take 50 mg by mouth daily. .  rosuvastatin (CRESTOR) 40 MG tablet, Take 40 mg by mouth daily. Marland Kitchen  telmisartan-hydrochlorothiazide (MICARDIS HCT) 80-25 MG tablet, Take 1 tablet by mouth daily.   Current Outpatient Medications (Analgesics):  .  aspirin  EC 81 MG tablet, Take 81 mg by mouth daily.   Current Outpatient Medications (Other):  .  cholecalciferol (VITAMIN D3) 25 MCG (1000 UT) tablet, Take 2,000 Units by mouth daily. .  pantoprazole (PROTONIX) 40 MG tablet, Take 20 mg by mouth daily.     Do not take your Micardis HCT the morning of the procedure  Take only 25 units of insulin the night before your procedure. Do not take any insulin on the day of the procedure.    On the morning of your procedure, take your Aspirin and any morning medicines NOT listed above.  You may use sips of water.  5. Plan for one night stay--bring personal belongings. 6. Bring a current list of your medications and current insurance cards. 7. You MUST have a responsible person to drive you home. 8. Someone MUST be with you the first 24 hours after you arrive home or your discharge will be delayed. 9. Please wear clothes that are easy to get on and off and wear slip-on shoes.  Thank you for allowing [place] to care for you!   -- [facility]       Signed, Parke Poisson, MD  10/16/2018 5:35 PM    [facility]

## 2018-10-16 NOTE — Patient Instructions (Addendum)
Medication Instructions:  Your physician recommends that you continue on your current medications as directed. Please refer to the Current Medication list given to you today.  If you need a refill on your cardiac medications before your next appointment, please call your pharmacy.   Lab work: Scientist, product/process development today If you have labs (blood work) drawn today and your tests are completely normal, you will receive your results only by: Marland Kitchen MyChart Message (if you have MyChart) OR . A paper copy in the mail If you have any lab test that is abnormal or we need to change your treatment, we will call you to review the results.  Testing/Procedures: Your physician has requested that you have a cardiac catheterization. Cardiac catheterization is used to diagnose and/or treat various heart conditions. Doctors may recommend this procedure for a number of different reasons. The most common reason is to evaluate chest pain. Chest pain can be a symptom of coronary artery disease (CAD), and cardiac catheterization can show whether plaque is narrowing or blocking your heart's arteries. This procedure is also used to evaluate the valves, as well as measure the blood flow and oxygen levels in different parts of your heart. For further information please visit [email or web address] Please follow instruction sheet, as given.    Follow-Up: At [facility], you and your health needs are our priority.  As part of our continuing mission to provide you with exceptional heart care, we have created designated Provider Care Teams.  These Care Teams include your primary Cardiologist (physician) and Advanced Practice Providers (APPs -  Physician Assistants and Nurse Practitioners) who all work together to provide you with the care you need, when you need it. You will need a follow up appointment after the procedure     You may see Dr.Acharya or one of the following Advanced Practice Providers on your designated Care Team:   Theodore Demark, PA-C . Joni Reining, DNP, ANP  Any Other Special Instructions Will Be Listed Below (If Applicable).     [facility]ASC[facility] [address] [place] [address] [place] [address] Dept: [phone] Loc: [phone]  Luceille Rutherford  10/16/2018  You are scheduled for a Cardiac Catheterization on Thursday, February 6 with Dr. Cristal Deer End.  1. Please arrive at the [facility] (Main Entrance A) at [facility]: [address] [place], [place] [address] at 7:00 AM (This time is two hours before your procedure to ensure your preparation). Free valet parking service is available.   Special note: Every effort is made to have your procedure done on time. Please understand that emergencies sometimes delay scheduled procedures.  2. Diet: Do not eat solid foods after midnight.  The patient may have clear liquids until 5am upon the day of the procedure.  3. Labs: You will need to have blood drawn today (Bmet, Cbc)  4. Medication instructions in preparation for your procedure:   Contrast Allergy: No   Current Outpatient Medications (Endocrine & Metabolic):  .  alendronate (FOSAMAX) 70 MG tablet, Take 70 mg by mouth once a week. Take with a full glass of water on an empty stomach. .  insulin aspart protamine- aspart (NOVOLOG MIX 70/30) (70-30) 100 UNIT/ML injection, Inject 0-50 Units into the skin 3 (three) times daily with meals. .  liraglutide (VICTOZA) 18 MG/3ML SOPN, Inject 0.3 mLs (1.8 mg total) into the skin daily.  Current Outpatient Medications (Cardiovascular):  .  diltiazem (DILACOR XR) 240 MG 24 hr  capsule, Take 240 mg by mouth daily. .  metoprolol succinate (TOPROL-XL) 50 MG 24 hr tablet, Take 50 mg by mouth daily. .  rosuvastatin (CRESTOR) 40 MG tablet, Take 40 mg by mouth daily. Marland Kitchen  telmisartan-hydrochlorothiazide (MICARDIS HCT) 80-25 MG tablet, Take 1 tablet by mouth daily.   Current  Outpatient Medications (Analgesics):  .  aspirin EC 81 MG tablet, Take 81 mg by mouth daily.   Current Outpatient Medications (Other):  .  cholecalciferol (VITAMIN D3) 25 MCG (1000 UT) tablet, Take 2,000 Units by mouth daily. .  pantoprazole (PROTONIX) 40 MG tablet, Take 20 mg by mouth daily.     Do not take your Micardis HCT the morning of the procedure  Take only 25 units of insulin the night before your procedure. Do not take any insulin on the day of the procedure.    On the morning of your procedure, take your Aspirin and any morning medicines NOT listed above.  You may use sips of water.  5. Plan for one night stay--bring personal belongings. 6. Bring a current list of your medications and current insurance cards. 7. You MUST have a responsible person to drive you home. 8. Someone MUST be with you the first 24 hours after you arrive home or your discharge will be delayed. 9. Please wear clothes that are easy to get on and off and wear slip-on shoes.  Thank you for allowing [place] to care for you!   -- [facility]

## 2018-10-17 LAB — CBC WITH DIFFERENTIAL/PLATELET
BASOS ABS: 0 10*3/uL (ref 0.0–0.2)
Basos: 0 %
EOS (ABSOLUTE): 0.2 10*3/uL (ref 0.0–0.4)
Eos: 3 %
HEMOGLOBIN: 11.8 g/dL (ref 11.1–15.9)
Hematocrit: 40.3 % (ref 34.0–46.6)
IMMATURE GRANULOCYTES: 0 %
Immature Grans (Abs): 0 10*3/uL (ref 0.0–0.1)
Lymphocytes Absolute: 2.4 10*3/uL (ref 0.7–3.1)
Lymphs: 33 %
MCH: 21.3 pg — AB (ref 26.6–33.0)
MCHC: 29.3 g/dL — ABNORMAL LOW (ref 31.5–35.7)
MCV: 73 fL — ABNORMAL LOW (ref 79–97)
Monocytes Absolute: 0.4 10*3/uL (ref 0.1–0.9)
Monocytes: 5 %
NEUTROS PCT: 59 %
Neutrophils Absolute: 4.2 10*3/uL (ref 1.4–7.0)
Platelets: 168 10*3/uL (ref 150–450)
RBC: 5.54 x10E6/uL — ABNORMAL HIGH (ref 3.77–5.28)
RDW: 14.3 % (ref 11.7–15.4)
WBC: 7.3 10*3/uL (ref 3.4–10.8)

## 2018-10-17 LAB — BASIC METABOLIC PANEL
BUN/Creatinine Ratio: 21 (ref 12–28)
BUN: 30 mg/dL — AB (ref 8–27)
CO2: 15 mmol/L — ABNORMAL LOW (ref 20–29)
Calcium: 9.9 mg/dL (ref 8.7–10.3)
Chloride: 107 mmol/L — ABNORMAL HIGH (ref 96–106)
Creatinine, Ser: 1.46 mg/dL — ABNORMAL HIGH (ref 0.57–1.00)
GFR calc Af Amer: 41 mL/min/{1.73_m2} — ABNORMAL LOW (ref >59)
GFR calc non Af Amer: 36 mL/min/{1.73_m2} — ABNORMAL LOW (ref >59)
Glucose: 271 mg/dL — ABNORMAL HIGH (ref 65–99)
Potassium: 4.9 mmol/L (ref 3.5–5.2)
Sodium: 143 mmol/L (ref 134–144)

## 2018-10-23 ENCOUNTER — Telehealth: Payer: Self-pay | Admitting: *Deleted

## 2018-10-23 NOTE — Telephone Encounter (Signed)
Pt contacted pre-catheterization scheduled at [facility] [place] for: Thursday October 25, 2018 1:30 PM Verified arrival time and place: [facility] Main Entrance A at: 8:30 AM pre procedure hydration  No solid food after midnight prior to cath, clear liquids until 5 AM day of procedure. Contrast allergy: no  Hold: Telmisartan-HCTZ-day before and day of procedure- GFR 41 KCl-day before and day of procedure.  Insulin-AM of procedure. Victoza- AM of procedure.   Except hold medications AM meds can be  taken pre-cath with sip of water including: ASA 81 mg  Confirmed patient has responsible person to drive home post procedure and observe 24 hours after arriving home: yes

## 2018-10-25 ENCOUNTER — Encounter (HOSPITAL_COMMUNITY)
Admission: RE | Disposition: A | Discharge status: Home / Self Care | Payer: Self-pay | Source: Home / Self Care | Attending: Internal Medicine

## 2018-10-25 ENCOUNTER — Ambulatory Visit (HOSPITAL_COMMUNITY)
Admission: RE | Admit: 2018-10-25 | Discharge: 2018-10-25 | Disposition: A | Discharge status: Home / Self Care | Payer: BLUE CROSS/BLUE SHIELD | Attending: Internal Medicine | Admitting: Internal Medicine

## 2018-10-25 ENCOUNTER — Other Ambulatory Visit: Payer: Self-pay

## 2018-10-25 DIAGNOSIS — I421 Obstructive hypertrophic cardiomyopathy: Secondary | ICD-10-CM | POA: Diagnosis present

## 2018-10-25 DIAGNOSIS — I2510 Atherosclerotic heart disease of native coronary artery without angina pectoris: Secondary | ICD-10-CM | POA: Diagnosis not present

## 2018-10-25 DIAGNOSIS — Z8249 Family history of ischemic heart disease and other diseases of the circulatory system: Secondary | ICD-10-CM | POA: Diagnosis not present

## 2018-10-25 DIAGNOSIS — Z90710 Acquired absence of both cervix and uterus: Secondary | ICD-10-CM | POA: Insufficient documentation

## 2018-10-25 DIAGNOSIS — K219 Gastro-esophageal reflux disease without esophagitis: Secondary | ICD-10-CM | POA: Diagnosis not present

## 2018-10-25 DIAGNOSIS — Z886 Allergy status to analgesic agent status: Secondary | ICD-10-CM | POA: Diagnosis not present

## 2018-10-25 DIAGNOSIS — Z794 Long term (current) use of insulin: Secondary | ICD-10-CM | POA: Insufficient documentation

## 2018-10-25 DIAGNOSIS — Z7982 Long term (current) use of aspirin: Secondary | ICD-10-CM | POA: Insufficient documentation

## 2018-10-25 DIAGNOSIS — Z823 Family history of stroke: Secondary | ICD-10-CM | POA: Diagnosis not present

## 2018-10-25 DIAGNOSIS — E119 Type 2 diabetes mellitus without complications: Secondary | ICD-10-CM | POA: Insufficient documentation

## 2018-10-25 DIAGNOSIS — Z833 Family history of diabetes mellitus: Secondary | ICD-10-CM | POA: Diagnosis not present

## 2018-10-25 DIAGNOSIS — E785 Hyperlipidemia, unspecified: Secondary | ICD-10-CM | POA: Insufficient documentation

## 2018-10-25 DIAGNOSIS — Z880 Allergy status to penicillin: Secondary | ICD-10-CM | POA: Insufficient documentation

## 2018-10-25 DIAGNOSIS — D509 Iron deficiency anemia, unspecified: Secondary | ICD-10-CM | POA: Insufficient documentation

## 2018-10-25 DIAGNOSIS — I10 Essential (primary) hypertension: Secondary | ICD-10-CM | POA: Insufficient documentation

## 2018-10-25 DIAGNOSIS — Z79899 Other long term (current) drug therapy: Secondary | ICD-10-CM | POA: Diagnosis not present

## 2018-10-25 DIAGNOSIS — R079 Chest pain, unspecified: Secondary | ICD-10-CM

## 2018-10-25 HISTORY — PX: LEFT HEART CATH AND CORONARY ANGIOGRAPHY: CATH118249

## 2018-10-25 LAB — GLUCOSE, CAPILLARY
Glucose-Capillary: 86 mg/dL (ref 70–99)
Glucose-Capillary: 63 mg/dL — ABNORMAL LOW (ref 70–99)

## 2018-10-25 SURGERY — LEFT HEART CATH AND CORONARY ANGIOGRAPHY
Anesthesia: LOCAL

## 2018-10-25 MED ORDER — LIDOCAINE HCL (PF) 1 % IJ SOLN
INTRAMUSCULAR | Status: DC | PRN
  Administered 2018-10-25: 5 mL via INTRADERMAL

## 2018-10-25 MED ORDER — SODIUM CHLORIDE 0.9% FLUSH: 3.0000 mL | INTRAVENOUS | Status: DC | PRN

## 2018-10-25 MED ORDER — SODIUM CHLORIDE 0.9 % IV SOLN: 250.0000 mL | INTRAVENOUS | Status: DC | PRN

## 2018-10-25 MED ORDER — ACETAMINOPHEN 325 MG PO TABS: 650.0000 mg | ORAL_TABLET | ORAL | Status: DC | PRN

## 2018-10-25 MED ORDER — FENTANYL CITRATE (PF) 100 MCG/2ML IJ SOLN
INTRAMUSCULAR | Status: AC
  Filled 2018-10-25: qty 2

## 2018-10-25 MED ORDER — ASPIRIN 81 MG PO CHEW
81.0000 mg | CHEWABLE_TABLET | ORAL | Status: AC
  Administered 2018-10-25: 81 mg via ORAL
  Filled 2018-10-25: qty 1

## 2018-10-25 MED ORDER — SODIUM CHLORIDE 0.9 % IV SOLN: INTRAVENOUS | Status: DC

## 2018-10-25 MED ORDER — SODIUM CHLORIDE 0.9% FLUSH: 3.0000 mL | Freq: Two times a day (BID) | INTRAVENOUS | Status: DC

## 2018-10-25 MED ORDER — MIDAZOLAM HCL 2 MG/2ML IJ SOLN
INTRAMUSCULAR | Status: DC | PRN
  Administered 2018-10-25: 1 mg via INTRAVENOUS

## 2018-10-25 MED ORDER — HEPARIN (PORCINE) IN NACL 1000-0.9 UT/500ML-% IV SOLN
INTRAVENOUS | Status: AC
  Filled 2018-10-25: qty 1000

## 2018-10-25 MED ORDER — LIDOCAINE HCL (PF) 1 % IJ SOLN
INTRAMUSCULAR | Status: AC
  Filled 2018-10-25: qty 30

## 2018-10-25 MED ORDER — IOHEXOL 350 MG/ML SOLN
INTRAVENOUS | Status: DC | PRN
  Administered 2018-10-25: 30 mL via INTRA_ARTERIAL

## 2018-10-25 MED ORDER — ONDANSETRON HCL 4 MG/2ML IJ SOLN: 4.0000 mg | Freq: Four times a day (QID) | INTRAMUSCULAR | Status: DC | PRN

## 2018-10-25 MED ORDER — SODIUM CHLORIDE 0.9 % WEIGHT BASED INFUSION: 1.0000 mL/kg/h | INTRAVENOUS | Status: DC

## 2018-10-25 MED ORDER — FENTANYL CITRATE (PF) 100 MCG/2ML IJ SOLN
INTRAMUSCULAR | Status: DC | PRN
  Administered 2018-10-25: 25 ug via INTRAVENOUS

## 2018-10-25 MED ORDER — SODIUM CHLORIDE 0.9 % WEIGHT BASED INFUSION
3.0000 mL/kg/h | INTRAVENOUS | Status: AC
  Administered 2018-10-25: 3 mL/kg/h via INTRAVENOUS

## 2018-10-25 MED ORDER — VERAPAMIL HCL 2.5 MG/ML IV SOLN
INTRAVENOUS | Status: AC
  Filled 2018-10-25: qty 2

## 2018-10-25 MED ORDER — HEPARIN (PORCINE) IN NACL 1000-0.9 UT/500ML-% IV SOLN
INTRAVENOUS | Status: DC | PRN
  Administered 2018-10-25 (×2): 500 mL

## 2018-10-25 MED ORDER — MIDAZOLAM HCL 2 MG/2ML IJ SOLN
INTRAMUSCULAR | Status: AC
  Filled 2018-10-25: qty 2

## 2018-10-25 MED ORDER — HEPARIN SODIUM (PORCINE) 1000 UNIT/ML IJ SOLN
INTRAMUSCULAR | Status: DC | PRN
  Administered 2018-10-25: 3500 [IU] via INTRAVENOUS

## 2018-10-25 SURGICAL SUPPLY — 13 items
CATH 5FR JL3.5 JR4 ANG PIG MP (CATHETERS) ×1 IMPLANT
CATH LANGSTON DUAL LUM PIG 6FR (CATHETERS) ×1 IMPLANT
DEVICE RAD COMP TR BAND LRG (VASCULAR PRODUCTS) ×1 IMPLANT
GLIDESHEATH SLEND SS 6F .021 (SHEATH) ×1 IMPLANT
GUIDEWIRE INQWIRE 1.5J.035X260 (WIRE) IMPLANT
INQWIRE 1.5J .035X260CM (WIRE) ×2
KIT HEART LEFT (KITS) ×2 IMPLANT
PACK CARDIAC CATHETERIZATION (CUSTOM PROCEDURE TRAY) ×2 IMPLANT
TRANSDUCER W/STOPCOCK (MISCELLANEOUS) ×2 IMPLANT
TUBING ART PRESS 72  MALE/FEM (TUBING) ×1
TUBING ART PRESS 72 MALE/FEM (TUBING) IMPLANT
TUBING CIL FLEX 10 FLL-RA (TUBING) ×2 IMPLANT
WIRE HITORQ VERSACORE ST 145CM (WIRE) ×1 IMPLANT

## 2018-10-25 NOTE — Interval H&P Note (Signed)
History and Physical Interval Note:  10/25/2018 2:18 PM  Emily Mathews  has presented today for cardiac catheterization, with the diagnosis of HCM, chest pain, and dyspnea on exertion.  The various methods of treatment have been discussed with the patient and family. After consideration of risks, benefits and other options for treatment, the patient has consented to  Procedure(s): LEFT HEART CATH AND CORONARY ANGIOGRAPHY (N/A) as a surgical intervention .  The patient's history has been reviewed, patient examined, no change in status, stable for surgery.  I have reviewed the patient's chart and labs.  Questions were answered to the patient's satisfaction.    Cath Lab Visit (complete for each Cath Lab visit)  Clinical Evaluation Leading to the Procedure:   ACS: No.  Non-ACS:    Anginal Classification: CCS III  Anti-ischemic medical therapy: Maximal Therapy (2 or more classes of medications)  Non-Invasive Test Results: Equivocal test results  Prior CABG: No previous CABG  Tanylah Schnoebelen

## 2018-10-25 NOTE — Discharge Instructions (Signed)
Radial Site Care ° °This sheet gives you information about how to care for yourself after your procedure. Your health care provider may also give you more specific instructions. If you have problems or questions, contact your health care provider. °What can I expect after the procedure? °After the procedure, it is common to have: °· Bruising and tenderness at the catheter insertion area. °Follow these instructions at home: °Medicines °· Take over-the-counter and prescription medicines only as told by your health care provider. °Insertion site care °· Follow instructions from your health care provider about how to take care of your insertion site. Make sure you: °? Wash your hands with soap and water before you change your bandage (dressing). If soap and water are not available, use hand sanitizer. °? Change your dressing as told by your health care provider. °? Leave stitches (sutures), skin glue, or adhesive strips in place. These skin closures may need to stay in place for 2 weeks or longer. If adhesive strip edges start to loosen and curl up, you may trim the loose edges. Do not remove adhesive strips completely unless your health care provider tells you to do that. °· Check your insertion site every day for signs of infection. Check for: °? Redness, swelling, or pain. °? Fluid or blood. °? Pus or a bad smell. °? Warmth. °· Do not take baths, swim, or use a hot tub until your health care provider approves. °· You may shower 24-48 hours after the procedure, or as directed by your health care provider. °? Remove the dressing and gently wash the site with plain soap and water. °? Pat the area dry with a clean towel. °? Do not rub the site. That could cause bleeding. °· Do not apply powder or lotion to the site. °Activity ° °· For 24 hours after the procedure, or as directed by your health care provider: °? Do not flex or bend the affected arm. °? Do not push or pull heavy objects with the affected arm. °? Do not  drive yourself home from the hospital or clinic. You may drive 24 hours after the procedure unless your health care provider tells you not to. °? Do not operate machinery or power tools. °· Do not lift anything that is heavier than 10 lb (4.5 kg), or the limit that you are told, until your health care provider says that it is safe. °· Ask your health care provider when it is okay to: °? Return to work or school. °? Resume usual physical activities or sports. °? Resume sexual activity. °General instructions °· If the catheter site starts to bleed, raise your arm and put firm pressure on the site. If the bleeding does not stop, get help right away. This is a medical emergency. °· If you went home on the same day as your procedure, a responsible adult should be with you for the first 24 hours after you arrive home. °· Keep all follow-up visits as told by your health care provider. This is important. °Contact a health care provider if: °· You have a fever. °· You have redness, swelling, or yellow drainage around your insertion site. °Get help right away if: °· You have unusual pain at the radial site. °· The catheter insertion area swells very fast. °· The insertion area is bleeding, and the bleeding does not stop when you hold steady pressure on the area. °· Your arm or hand becomes pale, cool, tingly, or numb. °These symptoms may represent a serious problem   that is an emergency. Do not wait to see if the symptoms will go away. Get medical help right away. Call your local emergency services (911 in the [place] Do not drive yourself to the hospital. °Summary °· After the procedure, it is common to have bruising and tenderness at the site. °· Follow instructions from your health care provider about how to take care of your radial site wound. Check the wound every day for signs of infection. °· Do not lift anything that is heavier than 10 lb (4.5 kg), or the limit that you are told, until your health care provider says  that it is safe. °This information is not intended to replace advice given to you by your health care provider. Make sure you discuss any questions you have with your health care provider. °Document Released: 10/08/2010 Document Revised: 10/11/2017 Document Reviewed: 10/11/2017 °Elsevier Interactive Patient Education © 2019 [employer] ° °

## 2018-10-25 NOTE — Brief Op Note (Signed)
BRIEF CARDIAC CATHETERIZATION NOTE  10/25/2018  3:36 PM  PATIENT:  Emily BameJosephine Kaczynski  73 y.o. female  PRE-OPERATIVE DIAGNOSIS:  cp  POST-OPERATIVE DIAGNOSIS:  * No post-op diagnosis entered *  PROCEDURE:  Procedure(s): LEFT HEART CATH AND CORONARY ANGIOGRAPHY (N/A)  SURGEON:  Surgeon(s) and Role:    * Jhalen Eley, MD - Primary  FINDINGS: 1. Mild, non-obstructive CAD. 2. HCM with dynamic mid-cavitary gradient; resting (mean 21, peak-to-peak 40); post-PVC (mean 69, peak-to-peak 110). 3. Normal LVEDP.  RECOMMENDATIONS: 1. Risk factor modification and medical therapy to prevent progression of CAD. 2. Medical therapy for HOCM (HR control, avoidance of dehydration and afterload reduction).  Yvonne Kendallhristopher Nikola Blackston, MD [facility] Pager: [phone]

## 2018-10-26 ENCOUNTER — Encounter (HOSPITAL_COMMUNITY): Payer: Self-pay | Admitting: Internal Medicine

## 2018-10-26 MED FILL — Lidocaine HCl Local Preservative Free (PF) Inj 1%: INTRAMUSCULAR | Qty: 30 | Status: AC

## 2018-10-26 MED FILL — Verapamil HCl IV Soln 2.5 MG/ML: INTRAVENOUS | Qty: 2 | Status: AC

## 2018-10-29 ENCOUNTER — Telehealth: Payer: Self-pay

## 2018-10-29 ENCOUNTER — Other Ambulatory Visit: Payer: Self-pay

## 2018-10-29 DIAGNOSIS — R7989 Other specified abnormal findings of blood chemistry: Secondary | ICD-10-CM

## 2018-10-29 NOTE — Telephone Encounter (Signed)
Per Dr.Acharya the pt will need to come to the office early this week for lab (Bmet). Lmtcb. Order in [place]

## 2018-10-29 NOTE — Telephone Encounter (Signed)
Patient returned call.  I advised her of lab order placed.  Gave her hours of our lab. Patient verbalized understanding.

## 2018-10-29 NOTE — Telephone Encounter (Signed)
Noted  

## 2018-10-29 NOTE — Telephone Encounter (Signed)
-----   Message from Parke Poisson, MD sent at 10/26/2018  2:50 PM EST ----- Hyacinth Meeker, Yes we can definitely arrange that. Thanks for taking care of her.   Misty Stanley, could you arrange for a BMET early next week? When it is back we can call her and discuss further medication titration and when to see her next. Thanks, Anselm Jungling ----- Message ----- From: Yvonne Kendall, MD Sent: 10/25/2018   6:38 PM EST To: Parke Poisson, MD  Carlton Adam,  Ms. Filippini's cath did not show any significant CAD (only thing I saw was a 30% mid LAD stenosis).  She has a pretty pronounced mid cavitary gradient (mean gradient up to 69 mmHg post-PVC).  I bet her symptoms are mostly due to HOCM.  I only used 30 mL of contrast, but given her CKD, do you mind having the office set her up for a BMP sometime next week?  Let me know if any questions come up.  Thanks.  Thayer Ohm

## 2018-11-02 NOTE — Telephone Encounter (Signed)
Pt was to come in to the office early this week for a repeat Bmet after her cardiac cath. Called pt to f/u. Pt sts that she was unable to come in for lab this week. She will be able to come in for lab on  Mon 11/05/18.

## 2018-11-05 LAB — BASIC METABOLIC PANEL
BUN/Creatinine Ratio: 23 (ref 12–28)
BUN: 30 mg/dL — ABNORMAL HIGH (ref 8–27)
CO2: 19 mmol/L — ABNORMAL LOW (ref 20–29)
Calcium: 9.4 mg/dL (ref 8.7–10.3)
Chloride: 107 mmol/L — ABNORMAL HIGH (ref 96–106)
Creatinine, Ser: 1.28 mg/dL — ABNORMAL HIGH (ref 0.57–1.00)
GFR calc Af Amer: 48 mL/min/{1.73_m2} — ABNORMAL LOW (ref >59)
GFR calc non Af Amer: 42 mL/min/{1.73_m2} — ABNORMAL LOW (ref >59)
Glucose: 116 mg/dL — ABNORMAL HIGH (ref 65–99)
Potassium: 5.2 mmol/L (ref 3.5–5.2)
Sodium: 141 mmol/L (ref 134–144)

## 2018-12-03 ENCOUNTER — Telehealth: Payer: Self-pay

## 2018-12-03 MED ORDER — METOPROLOL SUCCINATE ER 50 MG PO TB24: 50.0000 mg | ORAL_TABLET | Freq: Every day | ORAL | 0 refills | Status: DC

## 2018-12-03 MED ORDER — TELMISARTAN-HCTZ 80-25 MG PO TABS: 1.0000 | ORAL_TABLET | Freq: Every day | ORAL | 0 refills | Status: DC

## 2018-12-03 NOTE — Telephone Encounter (Signed)
2nd attempt to contact the pt. Rx for Metoprolol and Micardis HCT refilled for #90 R-0. Pt appt with Dr.Acharya cancelled for 12/04/18. Pt is to call back to schedule a June 2020 appt with Dr.Acharya. Pt is to contact the office sooner if symptoms or concerns develop.

## 2018-12-03 NOTE — Telephone Encounter (Signed)
Per Dr.Acharya, if the pt is doing well from a cardiac standpoint the pt appt can be rescheduled for June 2020.  Spoke with the pt who sts that she is doing well, but she will need refills for her Metoprolol and Micardis. Adv the pt that since the Rx were not written by Dr.Acharya and need to obtain her approval. Adv the pt that I will call her back to discuss and reschedule her 12/04/18 appt with Dr.Acharya to June 2020. Pt verbalizes understanding.

## 2018-12-04 ENCOUNTER — Ambulatory Visit: Payer: Medicare Other | Admitting: Internal Medicine

## 2019-01-07 NOTE — Telephone Encounter (Signed)
Called pt verbal consent obtained BP YES  scale  NO  MyChart NO  email NO    In the setting of the current Covid19 crisis, you are scheduled for a TELEPHONE visit with your provider on 01-29-2019 at 120PM.  Just as we do with many in-office visits, in order for you to participate in this visit, we must obtain consent.  If you'd like, I can send this to your mychart (if signed up) or email for you to review.  Otherwise, I can obtain your verbal consent now.  All virtual visits are billed to your insurance company just like a normal visit would be.  By agreeing to a virtual visit, we'd like you to understand that the technology does not allow for your provider to perform an examination, and thus may limit your provider's ability to fully assess your condition.  Finally, though the technology is pretty good, we cannot assure that it will always work on either your or our end, and in the setting of a video visit, we may have to convert it to a phone-only visit.  In either situation, we cannot ensure that we have a secure connection.  Are you willing to proceed?  Verbalized consent.

## 2019-01-28 ENCOUNTER — Telehealth: Payer: Self-pay | Admitting: Internal Medicine

## 2019-01-29 ENCOUNTER — Telehealth: Payer: Self-pay | Admitting: *Deleted

## 2019-01-29 ENCOUNTER — Telehealth (INDEPENDENT_AMBULATORY_CARE_PROVIDER_SITE_OTHER): Payer: BLUE CROSS/BLUE SHIELD | Admitting: Internal Medicine

## 2019-01-29 ENCOUNTER — Encounter: Payer: Self-pay | Admitting: Internal Medicine

## 2019-01-29 VITALS — BP 131/79 | HR 66 | Ht 59.0 in

## 2019-01-29 DIAGNOSIS — I422 Other hypertrophic cardiomyopathy: Secondary | ICD-10-CM | POA: Diagnosis not present

## 2019-01-29 MED ORDER — TELMISARTAN-HCTZ 80-25 MG PO TABS: 1.0000 | ORAL_TABLET | Freq: Every day | ORAL | 3 refills | Status: DC

## 2019-01-29 MED ORDER — METOPROLOL SUCCINATE ER 50 MG PO TB24: 50.0000 mg | ORAL_TABLET | Freq: Every day | ORAL | 3 refills | Status: DC

## 2019-01-29 MED ORDER — DILTIAZEM HCL ER 240 MG PO CP24: 240.0000 mg | ORAL_CAPSULE | Freq: Every day | ORAL | 3 refills | Status: DC

## 2019-01-29 NOTE — Progress Notes (Signed)
Virtual Visit via Telephone Note   This visit type was conducted due to national recommendations for restrictions regarding the COVID-19 Pandemic (e.g. social distancing) in an effort to limit this patient's exposure and mitigate transmission in our community.  Due to her co-morbid illnesses, this patient is at least at moderate risk for complications without adequate follow up.  This format is felt to be most appropriate for this patient at this time.  The patient did not have access to video technology/had technical difficulties with video requiring transitioning to audio format only (telephone).  All issues noted in this document were discussed and addressed.  No physical exam could be performed with this format.  Please refer to the patient's chart for her  consent to telehealth for [facility].   Date:  01/29/2019   ID:  Emily Mathews, DOB [id number], MRN [id number]  Patient Location: Home Provider Location: Home  PCP:  Loyal Jacobson, MD  Cardiologist:  Parke Poisson, MD  Electrophysiologist:  None   Evaluation Performed:  Follow-Up Visit  Chief Complaint:  Follow-up hypertrophic cardiomyopathy  History of Present Illness:    Emily Mathews is a 73 y.o. 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  She also has a history of hypertension, hyperlipidemia, diabetes requiring insulin, microcytic anemia, GERD, and palpitations.   We performed a coronary angiogram in February for chest pain to exclude obstructive coronary disease vs symptoms from mid cavitary obstruction in HCM. She fortunately has mild, nonobstructive CAD. We then chose to focus on medical management of HCM.   She has done well since our last visit. She tells me she has not had any recurrent symptoms of chest pain and rare palpitations. 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 She has continued on diltiazem, metoprolol, and telmisartan - hctz. While she and I have discussed that this is not the ideal regimen for a patient with obstruction, she is comfortable on this stable longstanding regimen and is not keen to make changes while feeling well.   The patient denies chest pain, chest pressure, dyspnea at rest or with exertion, palpitations, PND, orthopnea, or leg swelling. Denies syncope or presyncope. Denies dizziness or lightheadedness.  The patient does not have symptoms concerning for COVID-19 infection (fever, chills, cough, or new shortness of breath).    Past Medical History:  Diagnosis Date   Diabetes mellitus without complication (HCC)    Heart murmur    Hypertension    Hypertrophic obstructive cardiomyopathy (HCC)    Past Surgical History:  Procedure Laterality Date   ABDOMINAL HYSTERECTOMY     CARDIAC CATHETERIZATION  12/29/2008   normal L main, LAD free of siease, Cfx free of disease, normal RCA, hypertrophic cardiomyopathy with 60mm subvalvular to apex gradient (Dr. Mervyn Skeeters. [place])   CESAREAN SECTION     LEFT HEART CATH AND CORONARY ANGIOGRAPHY N/A 10/25/2018   Procedure: LEFT HEART CATH AND CORONARY ANGIOGRAPHY;  Surgeon: Yvonne Kendall, MD;  Location: [facility];  Service: Cardiovascular;  Laterality: N/A;   TRANSTHORACIC ECHOCARDIOGRAM  09/2012   EF 65-70%, severe septal hypertrophy, grade 1 diastolic dysfunction; LA in upper limites of normal in size     Current Meds  Medication Sig   alendronate (FOSAMAX) 70 MG tablet Take 70 mg by mouth every Sunday. Take with a full glass of water on an empty stomach.  aspirin EC 81 MG tablet Take 81 mg by mouth daily.   cholecalciferol (VITAMIN D3) 25 MCG (1000 UT) tablet Take 2,000 Units by mouth daily.   diltiazem (DILACOR XR) 240 MG 24 hr capsule Take 1 capsule (240 mg total) by mouth daily.   insulin aspart protamine- aspart (NOVOLOG MIX 70/30) (70-30) 100 UNIT/ML  injection Inject 40-55 Units into the skin 2 (two) times daily with a meal. Use sliding scale   liraglutide (VICTOZA) 18 MG/3ML SOPN Inject 0.3 mLs (1.8 mg total) into the skin daily.   Magnesium 250 MG TABS Take 250 mg by mouth daily.   metoprolol succinate (TOPROL-XL) 50 MG 24 hr tablet Take 1 tablet (50 mg total) by mouth daily.   pantoprazole (PROTONIX) 20 MG tablet Take 20 mg by mouth daily.   POTASSIUM PO Take 1 tablet by mouth daily.   telmisartan-hydrochlorothiazide (MICARDIS HCT) 80-25 MG tablet Take 1 tablet by mouth daily.   [DISCONTINUED] diltiazem (DILACOR XR) 240 MG 24 hr capsule Take 240 mg by mouth daily.   [DISCONTINUED] metoprolol succinate (TOPROL-XL) 50 MG 24 hr tablet Take 1 tablet (50 mg total) by mouth daily.   [DISCONTINUED] telmisartan-hydrochlorothiazide (MICARDIS HCT) 80-25 MG tablet Take 1 tablet by mouth daily.     Allergies:   Celebrex [celecoxib] and Penicillins   Social History   Tobacco Use   Smoking status: Never Smoker   Smokeless tobacco: Never Used  Substance Use Topics   Alcohol use: Yes    Comment: Occas   Drug use: No     Family Hx: The patient's family history includes Diabetes in her mother and sister; Heart disease in her mother; Hyperlipidemia in her mother and sister; Hypertension in her mother and sister; Stroke in her mother and sister.  ROS:   Please see the history of present illness.     All other systems reviewed and are negative.   Prior CV studies:   The following studies were reviewed today:  Cath 10/25/2018  Labs/Other Tests and Data Reviewed:    EKG:  No ECG reviewed.  Recent Labs: 08/23/2018: Magnesium 1.8; TSH 0.890 10/16/2018: Hemoglobin 11.8; Platelets 168 11/05/2018: BUN 30; Creatinine, Ser 1.28; Potassium 5.2; Sodium 141   Recent Lipid Panel No results found for: CHOL, TRIG, HDL, CHOLHDL, LDLCALC, LDLDIRECT  Wt Readings from Last 3 Encounters:  10/25/18 150 lb (68 kg)  10/16/18 149 lb (67.6 kg)    09/26/18 150 lb (68 kg)     Objective:    Vital Signs:  BP 131/79    Pulse 66    Ht 4\' 11"  (1.499 m)    BMI 30.30 kg/m    VITAL SIGNS:  reviewed GEN:  no acute distress  ASSESSMENT & PLAN:    1. Hypertrophic cardiomyopathy (HCC)    HCM - stable without symptoms currently. No change to therapy at this time. She does have mid cavitary significant obstruction by both echo and cath, and we have discussed limiting vasodilators and diuretics. If she has recurrent symptoms, I would recommend stopping HCTZ and focusing on hydration.   Chest pain - no recurrence. Will monitor for symptoms.   Palpitations - infrequent, will monitor.   COVID-19 Education: The signs and symptoms of COVID-19 were discussed with the patient and how to seek care for testing (follow up with PCP or arrange E-visit).  The importance of social distancing was discussed today.  Time:   Today, I have spent 15 minutes with the patient with telehealth technology discussing the above  problems.     Medication Adjustments/Labs and Tests Ordered: Current medicines are reviewed at length with the patient today.  Concerns regarding medicines are outlined above.   Tests Ordered: No orders of the defined types were placed in this encounter.   Medication Changes: Meds ordered this encounter  Medications   metoprolol succinate (TOPROL-XL) 50 MG 24 hr tablet    Sig: Take 1 tablet (50 mg total) by mouth daily.    Dispense:  90 tablet    Refill:  3   diltiazem (DILACOR XR) 240 MG 24 hr capsule    Sig: Take 1 capsule (240 mg total) by mouth daily.    Dispense:  90 capsule    Refill:  3   telmisartan-hydrochlorothiazide (MICARDIS HCT) 80-25 MG tablet    Sig: Take 1 tablet by mouth daily.    Dispense:  90 tablet    Refill:  3    Disposition:  Follow up in 6 month(s)  Signed, Parke Poisson, MD  01/29/2019 11:11 AM    [facility]

## 2019-01-29 NOTE — Patient Instructions (Addendum)
  Medication Instructions:  NO CHANGES   REFILLED THIS MEDICATIONS--- DILTIAZEM , METOPROLOL , TELMISARTAN-HCT   Lab work:  NOT NEEDED  Testing/Procedures: NOT NEEDED  Follow-Up: At [facility], you and your health needs are our priority.  As part of our continuing mission to provide you with exceptional heart care, we have created designated Provider Care Teams.  These Care Teams include your primary Cardiologist (physician) and Advanced Practice Providers (APPs -  Physician Assistants and Nurse Practitioners) who all work together to provide you with the care you need, when you need it. . You will need a follow up appointment in 6 to7 months NOV-DEC 2020.  Please call our office 2 months in advance to schedule this appointment.  You may see or one of the following Advanced Practice Providers on your designated Care Team:   . Theodore Demark, PA-C . Joni Reining, DNP, ANP  Any Other Special Instructions Will Be Listed Below (If Applicable).

## 2019-01-29 NOTE — Telephone Encounter (Signed)
Spoke to patient instruction given at from 5/12 office appt.  avs summary will be mailed to patient. Voice understanding.

## 2019-03-19 NOTE — Telephone Encounter (Signed)
Opened in error

## 2019-04-14 ENCOUNTER — Other Ambulatory Visit: Payer: Self-pay

## 2019-04-14 ENCOUNTER — Inpatient Hospital Stay (HOSPITAL_COMMUNITY): Payer: BC Managed Care – PPO

## 2019-04-14 ENCOUNTER — Encounter (HOSPITAL_COMMUNITY): Payer: Self-pay | Admitting: Emergency Medicine

## 2019-04-14 ENCOUNTER — Inpatient Hospital Stay (HOSPITAL_COMMUNITY)
Admission: EM | Admit: 2019-04-14 | Discharge: 2019-04-15 | DRG: 641 | Disposition: A | Discharge status: Home / Self Care | Payer: BC Managed Care – PPO | Attending: Family Medicine | Admitting: Family Medicine

## 2019-04-14 ENCOUNTER — Emergency Department (HOSPITAL_COMMUNITY): Payer: BC Managed Care – PPO

## 2019-04-14 DIAGNOSIS — E875 Hyperkalemia: Principal | ICD-10-CM

## 2019-04-14 DIAGNOSIS — I959 Hypotension, unspecified: Secondary | ICD-10-CM | POA: Diagnosis not present

## 2019-04-14 DIAGNOSIS — N183 Chronic kidney disease, stage 3 (moderate): Secondary | ICD-10-CM

## 2019-04-14 DIAGNOSIS — Z8249 Family history of ischemic heart disease and other diseases of the circulatory system: Secondary | ICD-10-CM

## 2019-04-14 DIAGNOSIS — K219 Gastro-esophageal reflux disease without esophagitis: Secondary | ICD-10-CM | POA: Diagnosis present

## 2019-04-14 DIAGNOSIS — E1122 Type 2 diabetes mellitus with diabetic chronic kidney disease: Secondary | ICD-10-CM | POA: Diagnosis present

## 2019-04-14 DIAGNOSIS — Z880 Allergy status to penicillin: Secondary | ICD-10-CM | POA: Diagnosis not present

## 2019-04-14 DIAGNOSIS — Z20828 Contact with and (suspected) exposure to other viral communicable diseases: Secondary | ICD-10-CM | POA: Diagnosis present

## 2019-04-14 DIAGNOSIS — Z7983 Long term (current) use of bisphosphonates: Secondary | ICD-10-CM

## 2019-04-14 DIAGNOSIS — I421 Obstructive hypertrophic cardiomyopathy: Secondary | ICD-10-CM | POA: Diagnosis present

## 2019-04-14 DIAGNOSIS — Z823 Family history of stroke: Secondary | ICD-10-CM | POA: Diagnosis not present

## 2019-04-14 DIAGNOSIS — Z7982 Long term (current) use of aspirin: Secondary | ICD-10-CM | POA: Diagnosis not present

## 2019-04-14 DIAGNOSIS — I129 Hypertensive chronic kidney disease with stage 1 through stage 4 chronic kidney disease, or unspecified chronic kidney disease: Secondary | ICD-10-CM | POA: Diagnosis present

## 2019-04-14 DIAGNOSIS — Z886 Allergy status to analgesic agent status: Secondary | ICD-10-CM

## 2019-04-14 DIAGNOSIS — R001 Bradycardia, unspecified: Secondary | ICD-10-CM | POA: Diagnosis present

## 2019-04-14 DIAGNOSIS — Z794 Long term (current) use of insulin: Secondary | ICD-10-CM

## 2019-04-14 DIAGNOSIS — Z833 Family history of diabetes mellitus: Secondary | ICD-10-CM

## 2019-04-14 DIAGNOSIS — Z8349 Family history of other endocrine, nutritional and metabolic diseases: Secondary | ICD-10-CM

## 2019-04-14 LAB — BASIC METABOLIC PANEL
Anion gap: 11 (ref 5–15)
Anion gap: 9 (ref 5–15)
Anion gap: 7 (ref 5–15)
Anion gap: 10 (ref 5–15)
BUN: 36 mg/dL — ABNORMAL HIGH (ref 8–23)
BUN: 31 mg/dL — ABNORMAL HIGH (ref 8–23)
BUN: 26 mg/dL — ABNORMAL HIGH (ref 8–23)
BUN: 23 mg/dL (ref 8–23)
CO2: 20 mmol/L — ABNORMAL LOW (ref 22–32)
CO2: 21 mmol/L — ABNORMAL LOW (ref 22–32)
CO2: 22 mmol/L (ref 22–32)
CO2: 22 mmol/L (ref 22–32)
Calcium: 9.5 mg/dL (ref 8.9–10.3)
Calcium: 9.6 mg/dL (ref 8.9–10.3)
Calcium: 9 mg/dL (ref 8.9–10.3)
Calcium: 9.2 mg/dL (ref 8.9–10.3)
Chloride: 106 mmol/L (ref 98–111)
Chloride: 113 mmol/L — ABNORMAL HIGH (ref 98–111)
Chloride: 111 mmol/L (ref 98–111)
Chloride: 109 mmol/L (ref 98–111)
Creatinine, Ser: 1.91 mg/dL — ABNORMAL HIGH (ref 0.44–1.00)
Creatinine, Ser: 1.59 mg/dL — ABNORMAL HIGH (ref 0.44–1.00)
Creatinine, Ser: 1.48 mg/dL — ABNORMAL HIGH (ref 0.44–1.00)
Creatinine, Ser: 1.4 mg/dL — ABNORMAL HIGH (ref 0.44–1.00)
GFR calc Af Amer: 30 mL/min — ABNORMAL LOW (ref >60)
GFR calc Af Amer: 37 mL/min — ABNORMAL LOW (ref >60)
GFR calc Af Amer: 41 mL/min — ABNORMAL LOW (ref >60)
GFR calc Af Amer: 43 mL/min — ABNORMAL LOW (ref >60)
GFR calc non Af Amer: 26 mL/min — ABNORMAL LOW (ref >60)
GFR calc non Af Amer: 32 mL/min — ABNORMAL LOW (ref >60)
GFR calc non Af Amer: 35 mL/min — ABNORMAL LOW (ref >60)
GFR calc non Af Amer: 37 mL/min — ABNORMAL LOW (ref >60)
Glucose, Bld: 385 mg/dL — ABNORMAL HIGH (ref 70–99)
Glucose, Bld: 244 mg/dL — ABNORMAL HIGH (ref 70–99)
Glucose, Bld: 225 mg/dL — ABNORMAL HIGH (ref 70–99)
Glucose, Bld: 75 mg/dL (ref 70–99)
Potassium: 7.2 mmol/L (ref 3.5–5.1)
Potassium: 5.5 mmol/L — ABNORMAL HIGH (ref 3.5–5.1)
Potassium: 4.5 mmol/L (ref 3.5–5.1)
Potassium: 4.3 mmol/L (ref 3.5–5.1)
Sodium: 137 mmol/L (ref 135–145)
Sodium: 143 mmol/L (ref 135–145)
Sodium: 140 mmol/L (ref 135–145)
Sodium: 141 mmol/L (ref 135–145)

## 2019-04-14 LAB — CBC
HCT: 43.6 % (ref 36.0–46.0)
Hemoglobin: 12.8 g/dL (ref 12.0–15.0)
MCH: 22.4 pg — ABNORMAL LOW (ref 26.0–34.0)
MCHC: 29.4 g/dL — ABNORMAL LOW (ref 30.0–36.0)
MCV: 76.4 fL — ABNORMAL LOW (ref 80.0–100.0)
Platelets: 192 10*3/uL (ref 150–400)
RBC: 5.71 MIL/uL — ABNORMAL HIGH (ref 3.87–5.11)
RDW: 18 % — ABNORMAL HIGH (ref 11.5–15.5)
WBC: 9 10*3/uL (ref 4.0–10.5)
nRBC: 0 % (ref 0.0–0.2)

## 2019-04-14 LAB — I-STAT CHEM 8, ED
BUN: 37 mg/dL — ABNORMAL HIGH (ref 8–23)
Calcium, Ion: 1.13 mmol/L — ABNORMAL LOW (ref 1.15–1.40)
Chloride: 109 mmol/L (ref 98–111)
Creatinine, Ser: 1.6 mg/dL — ABNORMAL HIGH (ref 0.44–1.00)
Glucose, Bld: 332 mg/dL — ABNORMAL HIGH (ref 70–99)
HCT: 35 % — ABNORMAL LOW (ref 36.0–46.0)
Hemoglobin: 11.9 g/dL — ABNORMAL LOW (ref 12.0–15.0)
Potassium: 7.7 mmol/L (ref 3.5–5.1)
Sodium: 137 mmol/L (ref 135–145)
TCO2: 25 mmol/L (ref 22–32)

## 2019-04-14 LAB — TROPONIN I (HIGH SENSITIVITY)
Troponin I (High Sensitivity): 4 ng/L (ref <18)
Troponin I (High Sensitivity): 4 ng/L (ref <18)

## 2019-04-14 LAB — CBG MONITORING, ED: Glucose-Capillary: 340 mg/dL — ABNORMAL HIGH (ref 70–99)

## 2019-04-14 LAB — GLUCOSE, CAPILLARY
Glucose-Capillary: 220 mg/dL — ABNORMAL HIGH (ref 70–99)
Glucose-Capillary: 208 mg/dL — ABNORMAL HIGH (ref 70–99)
Glucose-Capillary: 169 mg/dL — ABNORMAL HIGH (ref 70–99)

## 2019-04-14 LAB — HEMOGLOBIN A1C
Hgb A1c MFr Bld: 8.8 % — ABNORMAL HIGH (ref 4.8–5.6)
Mean Plasma Glucose: 205.86 mg/dL

## 2019-04-14 LAB — POTASSIUM: Potassium: >7.5 mmol/L (ref 3.5–5.1)

## 2019-04-14 LAB — SARS CORONAVIRUS 2 BY RT PCR (HOSPITAL ORDER, PERFORMED IN ~~LOC~~ HOSPITAL LAB): SARS Coronavirus 2: NEGATIVE

## 2019-04-14 LAB — MAGNESIUM: Magnesium: 2 mg/dL (ref 1.7–2.4)

## 2019-04-14 LAB — TSH: TSH: 3.567 u[IU]/mL (ref 0.350–4.500)

## 2019-04-14 MED ORDER — SODIUM CHLORIDE 0.9 % IV BOLUS (SEPSIS)
1000.0000 mL | Freq: Once | INTRAVENOUS | Status: AC
  Administered 2019-04-14: 1000 mL via INTRAVENOUS

## 2019-04-14 MED ORDER — INSULIN ASPART 100 UNIT/ML IV SOLN
10.0000 [IU] | Freq: Once | INTRAVENOUS | Status: AC
  Administered 2019-04-14: 10 [IU] via INTRAVENOUS

## 2019-04-14 MED ORDER — SODIUM POLYSTYRENE SULFONATE 15 GM/60ML PO SUSP: 15.0000 g | ORAL | Status: AC

## 2019-04-14 MED ORDER — ATROPINE SULFATE 1 MG/ML IJ SOLN
1.0000 mg | Freq: Once | INTRAMUSCULAR | Status: AC
  Administered 2019-04-14: 1 mg via INTRAVENOUS
  Filled 2019-04-14: qty 1

## 2019-04-14 MED ORDER — ONDANSETRON HCL 4 MG/2ML IJ SOLN
4.0000 mg | Freq: Once | INTRAMUSCULAR | Status: AC
  Administered 2019-04-14: 4 mg via INTRAVENOUS
  Filled 2019-04-14: qty 2

## 2019-04-14 MED ORDER — DEXTROSE 50 % IV SOLN
1.0000 | Freq: Once | INTRAVENOUS | Status: AC
  Administered 2019-04-14: 50 mL via INTRAVENOUS
  Filled 2019-04-14: qty 50

## 2019-04-14 MED ORDER — CALCIUM GLUCONATE 10 % IV SOLN
1.0000 g | Freq: Once | INTRAVENOUS | Status: AC
  Administered 2019-04-14: 1 g via INTRAVENOUS
  Filled 2019-04-14: qty 10

## 2019-04-14 MED ORDER — ASPIRIN 81 MG PO CHEW
324.0000 mg | CHEWABLE_TABLET | Freq: Once | ORAL | Status: AC
  Administered 2019-04-14: 324 mg via ORAL
  Filled 2019-04-14: qty 4

## 2019-04-14 MED ORDER — SODIUM ZIRCONIUM CYCLOSILICATE 10 G PO PACK
10.0000 g | PACK | Freq: Three times a day (TID) | ORAL | Status: AC
  Filled 2019-04-14 (×3): qty 1

## 2019-04-14 MED ORDER — SODIUM POLYSTYRENE SULFONATE 15 GM/60ML PO SUSP
45.0000 g | Freq: Once | ORAL | Status: AC
  Administered 2019-04-14: 45 g via ORAL
  Filled 2019-04-14: qty 180

## 2019-04-14 MED ORDER — VITAMIN D 25 MCG (1000 UNIT) PO TABS
2000.0000 [IU] | ORAL_TABLET | Freq: Every day | ORAL | Status: DC
  Administered 2019-04-14 – 2019-04-15 (×2): 2000 [IU] via ORAL
  Filled 2019-04-14 (×2): qty 2

## 2019-04-14 MED ORDER — SODIUM CHLORIDE 0.9 % IV SOLN
1.0000 g | Freq: Once | INTRAVENOUS | Status: AC
  Administered 2019-04-14: 1 g via INTRAVENOUS
  Filled 2019-04-14: qty 10

## 2019-04-14 MED ORDER — ASPIRIN EC 81 MG PO TBEC
81.0000 mg | DELAYED_RELEASE_TABLET | Freq: Every day | ORAL | Status: DC
  Administered 2019-04-15: 81 mg via ORAL
  Filled 2019-04-14: qty 1

## 2019-04-14 MED ORDER — INSULIN ASPART 100 UNIT/ML ~~LOC~~ SOLN
0.0000 [IU] | Freq: Three times a day (TID) | SUBCUTANEOUS | Status: DC
  Administered 2019-04-14: 3 [IU] via SUBCUTANEOUS
  Administered 2019-04-14: 5 [IU] via SUBCUTANEOUS
  Administered 2019-04-15 (×2): 3 [IU] via SUBCUTANEOUS

## 2019-04-14 MED ORDER — ALBUTEROL SULFATE (2.5 MG/3ML) 0.083% IN NEBU
10.0000 mg | INHALATION_SOLUTION | Freq: Once | RESPIRATORY_TRACT | Status: AC
  Administered 2019-04-14: 10 mg via RESPIRATORY_TRACT
  Filled 2019-04-14: qty 12

## 2019-04-14 MED ORDER — PANTOPRAZOLE SODIUM 20 MG PO TBEC
20.0000 mg | DELAYED_RELEASE_TABLET | Freq: Every day | ORAL | Status: DC
  Administered 2019-04-14 – 2019-04-15 (×2): 20 mg via ORAL
  Filled 2019-04-14 (×2): qty 1

## 2019-04-14 MED ORDER — SODIUM CHLORIDE 0.9 % IV BOLUS
1000.0000 mL | Freq: Once | INTRAVENOUS | Status: AC
  Administered 2019-04-14: 1000 mL via INTRAVENOUS

## 2019-04-14 MED ORDER — HEPARIN SODIUM (PORCINE) 5000 UNIT/ML IJ SOLN
5000.0000 [IU] | Freq: Three times a day (TID) | INTRAMUSCULAR | Status: DC
  Administered 2019-04-14 – 2019-04-15 (×3): 5000 [IU] via SUBCUTANEOUS
  Filled 2019-04-14 (×4): qty 1

## 2019-04-14 MED ORDER — LIRAGLUTIDE 18 MG/3ML ~~LOC~~ SOPN
1.8000 mg | PEN_INJECTOR | Freq: Every day | SUBCUTANEOUS | Status: DC
  Administered 2019-04-14: 1.8 mg via SUBCUTANEOUS

## 2019-04-14 NOTE — ED Notes (Signed)
Patient complaining of tightness in chest currently

## 2019-04-14 NOTE — ED Triage Notes (Signed)
Pt reports she began to feel weak around 2am.  Her heart rate was 40bpm.  Only complaint is "a little nausea." Hx of heart cath in Feb of this year.

## 2019-04-14 NOTE — Consult Note (Signed)
Renal Service Consult Note [facility]  Emily Mathews 04/14/2019 Sol Blazing Requesting Physician:  Dr Valeta Harms  Reason for Consult:  Hyperkalemia, CKD3 HPI: The patient is a 73 y.o. year-old with hx of HCM, GERD and HTN , was at work in [place] when she felt weak and checked her pulse it was 37 bpm.  Came to ED where HR 30-40's and labs showed ^^K+ at 7.7.  She received IV Ca gluc/ insulin / glucose and albuterol nebs 10mg  with now sig improvement w HR now in the 80's.  BP's stable. Asked to see for CKD3/ hyperkalemia.   Patient denies any hx kidney failure, does not see a kidney doctor. Denies any recent ankle swelling, voiding issues, SOB.  No diarrhea, + some nausea today.   ROS  denies CP  no joint pain   no HA  no blurry vision  no rash  no diarrhea  no nausea/ vomiting  no dysuria  no difficulty voiding  no change in urine color    Past Medical History  Past Medical History:  Diagnosis Date  . Diabetes mellitus without complication ([place])   . Heart murmur   . Hypertension   . Hypertrophic obstructive cardiomyopathy [facility])    Past Surgical History  Past Surgical History:  Procedure Laterality Date  . ABDOMINAL HYSTERECTOMY    . CARDIAC CATHETERIZATION  12/29/2008   normal L main, LAD free of siease, Cfx free of disease, normal RCA, hypertrophic cardiomyopathy with 2mm subvalvular to apex gradient (Dr. Loni Muse. [place])  . CESAREAN SECTION    . LEFT HEART CATH AND CORONARY ANGIOGRAPHY N/A 10/25/2018   Procedure: LEFT HEART CATH AND CORONARY ANGIOGRAPHY;  Surgeon: Nelva Bush, MD;  Location: [facility];  Service: Cardiovascular;  Laterality: N/A;  . TRANSTHORACIC ECHOCARDIOGRAM  09/2012   EF 65-70%, severe septal hypertrophy, grade 1 diastolic dysfunction; LA in upper limites of normal in size   Family History  Family History  Problem Relation Age of Onset  . Diabetes Mother   . Heart disease Mother   . Hypertension Mother   . Stroke Mother    . Hyperlipidemia Mother   . Diabetes Sister   . Hypertension Sister   . Stroke Sister   . Hyperlipidemia Sister    Social History  reports that she has never smoked. She has never used smokeless tobacco. She reports current alcohol use. She reports that she does not use drugs. Allergies  Allergies  Allergen Reactions  . Celebrex [Celecoxib] Rash  . Penicillins Hives    Did it involve swelling of the face/tongue/throat, SOB, or low BP? No Did it involve sudden or severe rash/hives, skin peeling, or any reaction on the inside of your mouth or nose? No Did you need to seek medical attention at a hospital or doctor's office? No When did it last happen?Several years ago If all above answers are "NO", may proceed with cephalosporin use.    Home medications Prior to Admission medications   Medication Sig Start Date End Date Taking? Authorizing Provider  alendronate (FOSAMAX) 70 MG tablet Take 70 mg by mouth every Sunday. Take with a full glass of water on an empty stomach.    Yes [provider]  aspirin EC 81 MG tablet Take 81 mg by mouth daily.   Yes [provider]  cholecalciferol (VITAMIN D3) 25 MCG (1000 UT) tablet Take 2,000 Units by mouth daily.   Yes [provider]  diltiazem (DILACOR XR) 240 MG 24 hr  capsule Take 1 capsule (240 mg total) by mouth daily. 01/29/19  Yes Parke PoissonAcharya, Gayatri A, MD  insulin aspart protamine- aspart (NOVOLOG MIX 70/30) (70-30) 100 UNIT/ML injection Inject 40-55 Units into the skin 2 (two) times daily with a meal. Use sliding scale   Yes [provider]  liraglutide (VICTOZA) 18 MG/3ML SOPN Inject 0.3 mLs (1.8 mg total) into the skin daily. 08/24/18  Yes Synetta ShadowPrince, Jamie M, MD  Magnesium 250 MG TABS Take 250 mg by mouth daily.   Yes [provider]  metoprolol succinate (TOPROL-XL) 50 MG 24 hr tablet Take 1 tablet (50 mg total) by mouth daily. 01/29/19  Yes Parke PoissonAcharya, Gayatri A, MD  pantoprazole (PROTONIX) 20 MG  tablet Take 20 mg by mouth daily.   Yes [provider]  rosuvastatin (CRESTOR) 40 MG tablet Take 40 mg by mouth daily.   Yes [provider]  telmisartan-hydrochlorothiazide (MICARDIS HCT) 80-25 MG tablet Take 1 tablet by mouth daily. 01/29/19  Yes Parke PoissonAcharya, Gayatri A, MD   Liver Function Tests No results for input(s): AST, ALT, ALKPHOS, BILITOT, PROT, ALBUMIN in the last 168 hours. No results for input(s): LIPASE, AMYLASE in the last 168 hours. CBC Recent Labs  Lab 04/14/19 0536 04/14/19 0655  WBC 9.0  --   HGB 12.8 11.9*  HCT 43.6 35.0*  MCV 76.4*  --   PLT 192  --    Basic Metabolic Panel Recent Labs  Lab 04/14/19 0536 04/14/19 0651 04/14/19 0655  NA 137  --  137  K 7.2* >7.5* 7.7*  CL 106  --  109  CO2 20*  --   --   GLUCOSE 385*  --  332*  BUN 36*  --  37*  CREATININE 1.91*  --  1.60*  CALCIUM 9.5  --   --    Iron/TIBC/Ferritin/ %Sat    Component Value Date/Time   FERRITIN 110 08/23/2018 1110    Vitals:   04/14/19 0830 04/14/19 0845 04/14/19 0900 04/14/19 0930  BP: 129/63 108/68 133/72 (!) 151/73  Pulse: 84 84 87 82  Resp: 16 17 (!) 21 (!) 25  Temp:      TempSrc:      SpO2: 98% 96% 90% 100%  Weight:      Height:        Exam Gen alert, HR 80's now improving, no distress, calm and pleasant No rash, cyanosis or gangrene Sclera anicteric, throat clear  No jvd or bruits Chest clear bilat to bases RRR no MRG Abd soft ntnd no mass or ascites +bs obese GU defer MS no joint effusions or deformity Ext no LE edema or UE edema Neuro is alert, Ox 3 , nf    Home meds:  - aspirin 81/ rosuvastatin 40 qd  - telmisartan-HCTZ 80-25 qd/ metoprolol xl 50 qd/ diltiazem 240 xr qd  - 70/30 insulin 40- 55u bid/ liraglutide 1.8mg  sq qd        Assessment/ Plan: 1. Hyperkalemia - severe, has been acutely treated in ED and HR's have improved. Continue checking q 4-6 hr K+ and will use kayexalate and/or lokelma as needed, we can prob get through this  w/o dialysis.  Of her meds only the KCl and the ARB are potentially problematic with regards to the hyperkalemia.  Would hold both for now and consider alternatives to ARB (would d/w cardiology first given diagnosis of HOCM) if more BP medication is needed. Will follow.  2. Bradycardia - due to combination of ^^K+/ CCB and BB,  improving w acute Rx (IV Ca/ insulin/ gluc/ alb nebs), HR up to 80's now.  3. CKD 3- creat close to baseline 1.6    Vinson Moselleob Madhav Mohon  MD 04/14/2019, 11:22 AM

## 2019-04-14 NOTE — ED Provider Notes (Signed)
TIME SEEN: 5:12 AM  CHIEF COMPLAINT: Generalized weakness, bradycardia  HPI: Patient is a 73 year old female with history of hypertension, diabetes, hypertrophic obstructive cardiomyopathy currently on metoprolol and diltiazem who presents to the emergency department with feeling weak and tired while at work around 2 AM.  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  She checked her vital signs and states her heart rate was 37.  She has been admitted for bradycardia in the past.  She denies any chest pain, shortness of breath.  She did have some nausea.  No vomiting or diarrhea.  No fever.  No changes in her medications recently.  ROS: See HPI Constitutional: no fever  Eyes: no drainage  ENT: no runny nose   Cardiovascular:  no chest pain  Resp: no SOB  GI: no vomiting GU: no dysuria Integumentary: no rash  Allergy: no hives  Musculoskeletal: no leg swelling  Neurological: no slurred speech ROS otherwise negative  PAST MEDICAL HISTORY/PAST SURGICAL HISTORY:  Past Medical History:  Diagnosis Date  . Diabetes mellitus without complication ([facility])   . Heart murmur   . Hypertension   . Hypertrophic obstructive cardiomyopathy ([facility])     MEDICATIONS:  Prior to Admission medications   Medication Sig Start Date End Date Taking? Authorizing Provider  alendronate (FOSAMAX) 70 MG tablet Take 70 mg by mouth every Sunday. Take with a full glass of water on an empty stomach.     [provider]  aspirin EC 81 MG tablet Take 81 mg by mouth daily.    [provider]  cholecalciferol (VITAMIN D3) 25 MCG (1000 UT) tablet Take 2,000 Units by mouth daily.    [provider]  diltiazem (DILACOR XR) 240 MG 24 hr capsule Take 1 capsule (240 mg total) by mouth daily. 01/29/19   Parke PoissonAcharya, Gayatri A, MD  insulin aspart protamine- aspart (NOVOLOG MIX 70/30) (70-30) 100 UNIT/ML injection Inject 40-55 Units into the skin 2 (two) times daily with a meal. Use sliding scale    [provider]  liraglutide (VICTOZA) 18 MG/3ML SOPN Inject 0.3 mLs (1.8 mg total) into the skin daily. 08/24/18   Synetta ShadowPrince, Jamie M, MD  Magnesium 250 MG TABS Take 250 mg by mouth daily.    [provider]  metoprolol succinate (TOPROL-XL) 50 MG 24 hr tablet Take 1 tablet (50 mg total) by mouth daily. 01/29/19   Parke PoissonAcharya, Gayatri A, MD  pantoprazole (PROTONIX) 20 MG tablet Take 20 mg by mouth daily.    [provider]  POTASSIUM PO Take 1 tablet by mouth daily.    [provider]  rosuvastatin (CRESTOR) 40 MG tablet Take 40 mg by mouth daily.    [provider]  telmisartan-hydrochlorothiazide (MICARDIS HCT) 80-25 MG tablet Take 1 tablet by mouth daily. 01/29/19   Parke PoissonAcharya, Gayatri A, MD    ALLERGIES:  Allergies  Allergen Reactions  . Celebrex [Celecoxib] Rash  . Penicillins Hives    Did it involve swelling of the face/tongue/throat, SOB, or low BP? No Did it involve sudden or severe rash/hives, skin peeling, or any reaction on the inside of your mouth or nose? No Did you need to seek medical attention at a hospital or doctor's office? No When did it last happen?Several years ago If all above answers are "NO", may proceed with cephalosporin use.     SOCIAL HISTORY:  Social History   Tobacco Use  . Smoking status: Never Smoker  . Smokeless tobacco: Never Used  Substance  Use Topics  . Alcohol use: Yes    Comment: Occas    FAMILY HISTORY: Family History  Problem Relation Age of Onset  . Diabetes Mother   . Heart disease Mother   . Hypertension Mother   . Stroke Mother   . Hyperlipidemia Mother   . Diabetes Sister   . Hypertension Sister   . Stroke Sister   . Hyperlipidemia Sister     EXAM: BP 120/67 (BP Location: Right Arm)   Pulse (!) 39   Temp (!) 97.5 F (36.4 C) (Oral)   Ht 4' 11.5" (1.511 m)   Wt 65.8 kg   BMI 28.80 kg/m  CONSTITUTIONAL: Alert and oriented and responds appropriately to questions. Well-appearing;  well-nourished, appears younger than stated age HEAD: Normocephalic EYES: Conjunctivae clear, pupils appear equal, EOMI ENT: normal nose; moist mucous membranes NECK: Supple, no meningismus, no nuchal rigidity, no LAD  CARD: Regular and bradycardic; S1 and S2 appreciated; no murmurs, no clicks, no rubs, no gallops RESP: Normal chest excursion without splinting or tachypnea; breath sounds clear and equal bilaterally; no wheezes, no rhonchi, no rales, no hypoxia or respiratory distress, speaking full sentences ABD/GI: Normal bowel sounds; non-distended; soft, non-tender, no rebound, no guarding, no peritoneal signs, no hepatosplenomegaly BACK:  The back appears normal and is non-tender to palpation, there is no CVA tenderness EXT: Normal ROM in all joints; non-tender to palpation; no edema; normal capillary refill; no cyanosis, no calf tenderness or swelling    SKIN: Normal color for age and race; warm; no rash NEURO: Moves all extremities equally PSYCH: The patient's mood and manner are appropriate. Grooming and personal hygiene are appropriate.  MEDICAL DECISION MAKING: Patient here with bradycardia.  Heart rate initially in the low 30s but now is in the 40s to 50s.  Appears to be a junctional rhythm.  She does have some ischemic abnormalities in the inferior and lateral leads which have been present previously.  She is on metoprolol as well as diltiazem.  It appears her cardiologist did not want her on an AV nodal blocker but she felt well controlled on her medication regimen and therefore it was not adjusted.  She is on metoprolol due to her history of hypertrophic cardiomyopathy.  Will check electrolytes today, TSH and cardiac labs to ensure there is no cardiac event.  Will give atropine if heart rate drops into the 30s again and is sustained.  Currently she is mentating normally with a normal blood pressure.  She has pacer pads on in case we need them.  I have discussed with her that she will need  admission to the hospital.  She verbalized understanding.  ED PROGRESS: 6:03 AM  Pt now verbalizing 8/10 anterior chest pressure.  Will repeat third EKG.  Troponin is pending.  Will give aspirin.  She has had slight drop in blood pressure to the low 100s systolic.  Heart rate in the low 40s.  Will give dose of atropine.  Will give IVF.   Patient's potassium level has come back at 7.2.  No visible hemolysis but will repeat potassium and give IV calcium.  Creatinine mildly elevated at 1.9.  Magnesium level normal.  Troponin negative.  Heart rate improved to the 50s to 60s after atropine.  Blood pressure still slightly low.  7:00 AM  Pt's repeat potassium was 7.7.  Will give second liter of IV fluids.  Will discuss with nephrology on-call as patient may need dialysis.  Albuterol, insulin have also been ordered.  She has received IV calcium.   7:17 AM  D/w Dr. Jonnie Finner with nephrology.  They will see patient in consultation.  Appreciate nephrology help.  He agrees with plan of care and also recommends giving 45 g of oral Kayexalate.  Patient is no longer having any active chest pain.  Did briefly have blood pressures in the 35K systolic but is now 093/81 on my reevaluation.  Still mentating normally.   It does appear patient is on potassium tablets daily.  This is likely the cause of her hyperkalemia today.   7:35 AM Discussed patient's case with CCM, Dr. Valeta Harms.  I have recommended admission and patient (and family if present) agree with this plan. Admitting physician will place admission orders.   I reviewed all nursing notes, vitals, pertinent previous records, EKGs, lab and urine results, imaging (as available).     EKG Interpretation  Date/Time:  Sunday April 14 2019 04:55:20 EDT Ventricular Rate:  40 PR Interval:    QRS Duration: 90 QT Interval:  478 QTC Calculation: 389 R Axis:   22 Text Interpretation:  Junctional bradycardia ST & T wave abnormality, consider inferior ischemia ST & T  wave abnormality, consider anterolateral ischemia Abnormal ECG No significant change since last tracing other than rate is slower Confirmed by Pryor Curia [phone]) on 04/14/2019 5:11:49 AM         EKG Interpretation  Date/Time:  Sunday April 14 2019 05:40:05 EDT Ventricular Rate:  42 PR Interval:    QRS Duration: 94 QT Interval:  500 QTC Calculation: 418 R Axis:   37 Text Interpretation:  Junctional rhythm Abnormal R-wave progression, early transition Repol abnrm, prob ischemia, anterolateral lds Baseline wander in lead(s) V3 No significant change since last tracing Confirmed by Pryor Curia [phone]) on 04/14/2019 6:00:10 AM        EKG Interpretation  Date/Time:  Sunday April 14 2019 06:08:21 EDT Ventricular Rate:  42 PR Interval:    QRS Duration: 106 QT Interval:  485 QTC Calculation: 406 R Axis:   22 Text Interpretation:  Junctional bradycardia Repol abnrm, prob ischemia, anterolateral lds No significant change since last tracing Confirmed by Pryor Curia [phone]) on 04/14/2019 6:47:43 AM           CRITICAL CARE Performed by: Cyril Mourning Magalene Mclear   Total critical care time: 65 minutes  Critical care time was exclusive of separately billable procedures and treating other patients.  Critical care was necessary to treat or prevent imminent or life-threatening deterioration.  Critical care was time spent personally by me on the following activities: development of treatment plan with patient and/or surrogate as well as nursing, discussions with consultants, evaluation of patient's response to treatment, examination of patient, obtaining history from patient or surrogate, ordering and performing treatments and interventions, ordering and review of laboratory studies, ordering and review of radiographic studies, pulse oximetry and re-evaluation of patient's condition.    Hazel Leveille, Delice Bison, DO 04/14/19 [phone]

## 2019-04-14 NOTE — H&P (Addendum)
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  She checked her vitals at work and HR was found to be 37. In ER found to have significant hyperkalemia with K >7.5. She was treated in ER with Ca, albuterol, insulin. She did have an episode of worsening symptomatic bradycardia with chest discomfort, hypotension, [place]a and was given atropine x1.   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  She checked her vitals at work and HR was found to be 37. In ER found to have significant hyperkalemia with K >7.5. She was treated in ER with Ca, albuterol, insulin. She did have an episode of worsening symptomatic bradycardia with chest discomfort, hypotension, nas[place]a and was given atropine x1.  Denies changes in medication although she does take potassium supplement.  Denies fever, cough, sob, sick contacts.   Past Medical History   Past Medical History:  Diagnosis Date  . Diabetes mellitus without complication (HCC)   . Heart murmur   . Hypertension   . Hypertrophic obstructive cardiomyopathy (HCC)     Significant Hospital Events     Consults:  Nephrology 7/26>>>  Procedures:    Significant Diagnostic Tests:    Micro Data:    Antimicrobials:     Interim history/subjective:  Feeling better.  HR improved in 80s   Objective   Blood pressure 129/63, pulse 84, temperature (!) 97.5 F (36.4 C), temperature source Oral, resp. rate 16, height 4' 11.5" (1.511 m), weight 65.8 kg, SpO2 98 %.        Intake/Output Summary (Last 24 hours) at 04/14/2019 [id number] Last data filed at  04/14/2019 [id number] Gross per 24 hour  Intake 1000 ml  Output -  Net 1000 ml   Filed Weights   04/14/19 0457  Weight: 65.8 kg    Examination: General: pleasant female, NAD  HENT: mm moist, no JVD  Lungs: resps even non labored on RA, clear  Cardiovascular: s1s2 rrr Abdomen: round, soft, non tender +bs  Extremities: warm and dry, no JVD  Neuro: awake, alert, appropriate, MAE  Resolved Hospital Problem list     Assessment & Plan:  Hyperkalemia with symptomatic bradycardia - etiology not completely clear. Does take potassium supplement. Also on B blocker, diltiazem for hypertrophic cardiomyopathy. Did require atropine x 1. HR improved at this time in 80's. Symptomatically improved.  Received insulin, Ca, albuterol, kayexalate in ER.  PLAN -  Admit to ICU for monitoring  TRH can assume care 7/27 Renal following - no indication for dialysis at this time  q4h kayexalate until K normalized F/u chem q4h  Hold potassium  Hold home diltiazem, metoprolol, hctz   DM  Plan -  Continue home victoza  SSI   Hypertrophic cardiomyopathy  HTN  PLAN -  Holding home meds as above   Best practice:  Diet: clear liquid diet  Pain/Anxiety/Delirium protocol (if indicated): n/a VAP protocol (if indicated): n/a DVT prophylaxis: SQ heparin  GI prophylaxis: n/a Glucose control: SSI  Mobility: BR with BSC  Code Status: full Family Communication: per pt  Disposition: ICU  Labs  CBC: Recent Labs  Lab 04/14/19 0536 04/14/19 0655  WBC 9.0  --   HGB 12.8 11.9*  HCT 43.6 35.0*  MCV 76.4*  --   PLT 192  --     Basic Metabolic Panel: Recent Labs  Lab 04/14/19 0536 04/14/19 0651 04/14/19 0655  NA 137  --  137  K 7.2* >7.5* 7.7*  CL 106  --  109  CO2 20*  --   --   GLUCOSE 385*  --  332*  BUN 36*  --  37*  CREATININE 1.91*  --  1.60*  CALCIUM 9.5  --   --   MG 2.0  --   --    GFR: Estimated Creatinine Clearance: 26.6 mL/min (A) (by C-G formula based on SCr of 1.6 mg/dL (H)).  Recent Labs  Lab 04/14/19 0536  WBC 9.0    Liver Function Tests: No results for input(s): AST, ALT, ALKPHOS, BILITOT, PROT, ALBUMIN in the last 168 hours. No results for input(s): LIPASE, AMYLASE in the last 168 hours. No results for input(s): AMMONIA in the last 168 hours.  ABG    Component Value Date/Time   TCO2 25 04/14/2019 0655     Coagulation Profile: No results for input(s): INR, PROTIME in the last 168 hours.  Cardiac Enzymes: No results for input(s): CKTOTAL, CKMB, CKMBINDEX, TROPONINI in the last 168 hours.  HbA1C: Hemoglobin A1C  Date/Time Value Ref Range Status  12/17/2015 01:07 PM 7.4  Final    CBG: Recent Labs  Lab 04/14/19 0702  GLUCAP 340*    Review of Systems:   As per HPI - All other systems reviewed and were neg.    Past Medical History  She,  has a past medical history of Diabetes mellitus without complication (HCC), Heart murmur, Hypertension, and Hypertrophic obstructive cardiomyopathy (HCC).   Surgical History    Past Surgical History:  Procedure Laterality Date  . ABDOMINAL HYSTERECTOMY    . CARDIAC CATHETERIZATION  12/29/2008   normal L main, LAD free of siease, Cfx free of disease, normal RCA, hypertrophic cardiomyopathy with 60mm subvalvular to apex gradient (Dr. Mervyn SkeetersA. Little)  . CESAREAN SECTION    . LEFT HEART CATH AND CORONARY ANGIOGRAPHY N/A 10/25/2018   Procedure: LEFT HEART CATH AND CORONARY ANGIOGRAPHY;  Surgeon: Yvonne KendallEnd, Christopher, MD;  Location: [facility];  Service: Cardiovascular;  Laterality: N/A;  . TRANSTHORACIC ECHOCARDIOGRAM  09/2012   EF 65-70%, severe septal hypertrophy, grade 1 diastolic dysfunction; LA in upper limites of normal in size     Social History   reports that she has never smoked. She has never used smokeless tobacco. She reports current alcohol use. She reports that she does not use drugs.   Family History   Her family history includes Diabetes in her mother and sister; Heart disease in her mother;  Hyperlipidemia in her mother and sister; Hypertension in her mother and sister; Stroke in her mother and sister.   Allergies Allergies  Allergen Reactions  . Celebrex [Celecoxib] Rash  . Penicillins Hives    Did it involve swelling of the face/tongue/throat, SOB, or low BP? No Did it involve sudden or severe rash/hives, skin peeling, or any reaction on the inside of your mouth or nose? No Did you need to seek medical attention at a hospital or doctor's office? No When did it last happen?Several years ago If all above answers are "NO", may proceed with cephalosporin use.      Home Medications  Prior to Admission  medications   Medication Sig Start Date End Date Taking? Authorizing Provider  alendronate (FOSAMAX) 70 MG tablet Take 70 mg by mouth every Sunday. Take with a full glass of water on an empty stomach.    Yes [provider]  aspirin EC 81 MG tablet Take 81 mg by mouth daily.   Yes [provider]  cholecalciferol (VITAMIN D3) 25 MCG (1000 UT) tablet Take 2,000 Units by mouth daily.   Yes [provider]  diltiazem (DILACOR XR) 240 MG 24 hr capsule Take 1 capsule (240 mg total) by mouth daily. 01/29/19  Yes Elouise Munroe, MD  insulin aspart protamine- aspart (NOVOLOG MIX 70/30) (70-30) 100 UNIT/ML injection Inject 40-55 Units into the skin 2 (two) times daily with a meal. Use sliding scale   Yes [provider]  liraglutide (VICTOZA) 18 MG/3ML SOPN Inject 0.3 mLs (1.8 mg total) into the skin daily. 08/24/18  Yes Carroll Sage, MD  Magnesium 250 MG TABS Take 250 mg by mouth daily.   Yes [provider]  metoprolol succinate (TOPROL-XL) 50 MG 24 hr tablet Take 1 tablet (50 mg total) by mouth daily. 01/29/19  Yes Elouise Munroe, MD  pantoprazole (PROTONIX) 20 MG tablet Take 20 mg by mouth daily.   Yes [provider]  Potassium 99 MG TABS Take 99 mg by mouth daily.   Yes [provider]  rosuvastatin (CRESTOR)  40 MG tablet Take 40 mg by mouth daily.   Yes [provider]  telmisartan-hydrochlorothiazide (MICARDIS HCT) 80-25 MG tablet Take 1 tablet by mouth daily. 01/29/19  Yes Elouise Munroe, MD     Critical care time: 63mins      Nickolas Madrid, NP 04/14/2019  8:52 AM Pager: [phone] or [phone]   PCCM  73 year old female admitted to the intensive care unit following an episode of symptomatic bradycardia and hyperkalemia.  Had potassium of 7.7 in the emergency room.  She does take a potassium placement at home.  She was found to have a slightly elevated creatinine, possible AKI.  She has medical history to include hypertrophic cardiomyopathy as well as type 2 diabetes on Victoza.  She was treated with Kayexalate insulin, D50 albuterol and calcium supplementation in the emergency room.  With symptomatic bradycardia into the 30s that she received a one-time dose of atropine in the ER.  She has since improved.  Pulmonary critical care was asked to admit to the ICU following administration of atropine and symptomatic bradycardia for closer observation as potassium levels come down.  BP 124/66 (BP Location: Left Arm)   Pulse 89   Temp 97.9 F (36.6 C) (Oral)   Resp 15   Ht 4' 11.5" (1.511 m)   Wt 65.8 kg   SpO2 100%   BMI 28.80 kg/m   General: Elderly female, resting in bed Heart: Regular rate and rhythm, S1-S2 no MRG Lungs: Bilateral breath sounds, clear, no crackles no wheeze Abdomen soft nontender nondistended Extremities: No edema Neuro: Awake alert following commands, no focal deficit  Labs reviewed Potassium initially greater than 7  Lab Results  Component Value Date   K 5.5 (H) 04/14/2019   Chest x-ray: Repeat chest x-ray with ill-defined linear opacities.  Possibly atelectasis. The patient's images have been independently reviewed by me.    Assessment:  Life-threatening hyperkalemia Symptomatic bradycardia secondary to above CKD stage III   Plan:  Patient has already received insulin, D50, albuterol treatments in the ER. Continue dosing of Kayexalate.  Frequent monitoring every 4-6 hours of potassium levels. Hold nephrotoxic agents Hold ARB We appreciate nephrology's input and recommendations. Observe in the intensive care unit for close hemodynamic and respiratory support. If potassium levels do not come down may need to consider dialysis however do not believe the be the case as her bradycardia and symptoms have already started to improve.  This patient is critically ill with multiple organ system failure; which, requires frequent high complexity decision making, assessment, support, evaluation, and titration of therapies. This was completed through the application of advanced monitoring technologies and extensive interpretation of multiple databases. During this encounter critical care time was devoted to patient care services described in this note for 32 minutes.   Emily IgoBradley L Gelisa Tieken, DO [facility] 04/14/2019 2:54 PM  Personal pager: [phone] If unanswered, please page CCM On-call: #[phone]

## 2019-04-14 NOTE — Progress Notes (Signed)
Spoke with Tommi Rumps, RN receiving pt. Requested she enter PIV consult if second site is needed. Pt currently not receiving IV medications, has patent site.

## 2019-04-15 ENCOUNTER — Encounter (HOSPITAL_COMMUNITY): Payer: Self-pay

## 2019-04-15 ENCOUNTER — Other Ambulatory Visit: Payer: Self-pay

## 2019-04-15 DIAGNOSIS — I959 Hypotension, unspecified: Secondary | ICD-10-CM

## 2019-04-15 DIAGNOSIS — R001 Bradycardia, unspecified: Secondary | ICD-10-CM

## 2019-04-15 DIAGNOSIS — E875 Hyperkalemia: Principal | ICD-10-CM

## 2019-04-15 LAB — CBC
HCT: 39.5 % (ref 36.0–46.0)
Hemoglobin: 11.7 g/dL — ABNORMAL LOW (ref 12.0–15.0)
MCH: 22.2 pg — ABNORMAL LOW (ref 26.0–34.0)
MCHC: 29.6 g/dL — ABNORMAL LOW (ref 30.0–36.0)
MCV: 74.8 fL — ABNORMAL LOW (ref 80.0–100.0)
Platelets: 186 10*3/uL (ref 150–400)
RBC: 5.28 MIL/uL — ABNORMAL HIGH (ref 3.87–5.11)
RDW: 17.3 % — ABNORMAL HIGH (ref 11.5–15.5)
WBC: 8.6 10*3/uL (ref 4.0–10.5)
nRBC: 0 % (ref 0.0–0.2)

## 2019-04-15 LAB — GLUCOSE, CAPILLARY
Glucose-Capillary: 151 mg/dL — ABNORMAL HIGH (ref 70–99)
Glucose-Capillary: 152 mg/dL — ABNORMAL HIGH (ref 70–99)
Glucose-Capillary: 175 mg/dL — ABNORMAL HIGH (ref 70–99)

## 2019-04-15 MED ORDER — METOPROLOL TARTRATE 25 MG PO TABS
25.0000 mg | ORAL_TABLET | Freq: Two times a day (BID) | ORAL | Status: DC
  Administered 2019-04-15: 15:00:00 25 mg via ORAL
  Filled 2019-04-15: qty 1

## 2019-04-15 MED ORDER — CHLORHEXIDINE GLUCONATE CLOTH 2 % EX PADS
6.0000 | MEDICATED_PAD | Freq: Every day | CUTANEOUS | Status: DC
  Administered 2019-04-15: 6 via TOPICAL

## 2019-04-15 MED ORDER — DILTIAZEM HCL ER COATED BEADS 240 MG PO CP24
240.0000 mg | ORAL_CAPSULE | Freq: Every day | ORAL | Status: DC
  Administered 2019-04-15: 240 mg via ORAL
  Filled 2019-04-15: qty 1

## 2019-04-15 NOTE — Progress Notes (Signed)
Chart reviewed, patient not seen.  Patient was admitted yesterday by PCCM for symptomatic bradycardia and hyperkalemia and both of them are medically treated and are normal now.  From chart review, patient seems to be ready for discharge.  Discussed with Dr. Nelda Marseille who will assume care and discharge patient today.

## 2019-04-15 NOTE — Discharge Summary (Signed)
Physician Discharge Summary  Patient ID: Emily Mathews MRN: [id number] DOB/AGE: [birth year] 73 y.o.  Admit date: 04/14/2019 Discharge date: 04/15/2019    Discharge Diagnoses:  Hyperkalemia  Symptomatic Bradycardia  Hypertrophic Cardiomyopathy  HTN Diabetes Mellitus                                                                      DISCHARGE PLAN BY DIAGNOSIS      Hyperkalemia  Symptomatic Bradycardia  Hypertrophic Cardiomyopathy  HTN  Discharge Plan: Follow up with Cardiology 7/29 at 1115 with Leonia Reader, NP Patient will need repeat BMP, Mg+ at time of visit  Will need to review home BP regimen  Diltiazem and Metoprolol continued at discharge  Micardis, Magnesium and Potassium supplementation held at discharge given symptomatic hyperkalemia    Diabetes Mellitus   Discharge Plan: Resume home regimen at discharge                      DISCHARGE SUMMARY   73 y/o F who presented to [facility] ER on 7/26 with complaints of weakness and fatigue.  She works as a Quarry manager and while at work checked her vital signs and found her HR to be 18.  She reported to the ER for evaluation.  On presentation she was found to have profound bradycardia and potassium > 7.5.  She was treated in the ER with calcium, albuterol, and insulin.  While in the ER, she had an episode of symptomatic bradycardia with chest discomfort, nausea / vomiting and hypotension.  She was given atropine x1.  Medication review showed the patient to be on an ARB, supplemental potassium and magnesium. Micardis, supplemental potassium and magnesium were held on admit.  She was treated with Lokelma and Kayexalate for hyperkalemia.  Follow up lab work showed stable potassium.  She was medically cleared for discharge on 7/27 with plans arranged for patient to follow up with Cardiology regarding BP control / HOCM regimen.  Home Cardizem and Metoprolol were restarted prior to discharge.   Discharge plans as above.               CONSULTS Nephrology    Discharge Exam: General: elderly female lying in bed in NAD Neuro: pleasant, AAOx4, speech clear, MAE, normal strength  CV: s1s2 rrr, no m/r/g, SR in 80's PULM: even/non-labored, lungs bilaterally clear, on room air  GI: abd soft, non-tender on exam, bsx4 normal, tolerating PO's Extremities: warm/dry, no edema   Vitals:   04/15/19 1154 04/15/19 1200 04/15/19 1300 04/15/19 1431  BP:  (!) 167/94 (!) 170/92 (!) 169/100  Pulse:  92 95 93  Resp:  20 (!) 22   Temp: 99.3 F (37.4 C)     TempSrc: Oral     SpO2:  97% 97%   Weight:      Height:         Discharge Labs  BMET Recent Labs  Lab 04/14/19 0536  04/14/19 0655 04/14/19 1119 04/14/19 1512 04/14/19 2004  NA 137  --  137 143 140 141  K 7.2*   < > 7.7* 5.5* 4.5 4.3  CL 106  --  109 113* 111 109  CO2 20*  --   --  21* 22 22  GLUCOSE  385*  --  332* 244* 225* 75  BUN 36*  --  37* 31* 26* 23  CREATININE 1.91*  --  1.60* 1.59* 1.48* 1.40*  CALCIUM 9.5  --   --  9.6 9.0 9.2  MG 2.0  --   --   --   --   --    < > = values in this interval not displayed.    CBC Recent Labs  Lab 04/14/19 0536 04/14/19 0655 04/15/19 0639  HGB 12.8 11.9* 11.7*  HCT 43.6 35.0* 39.5  WBC 9.0  --  8.6  PLT 192  --  186    Discharge Instructions    (HEART FAILURE PATIENTS) Call MD:  Anytime you have any of the following symptoms: 1) 3 pound weight gain in 24 hours or 5 pounds in 1 week 2) shortness of breath, with or without a dry hacking cough 3) swelling in the hands, feet or stomach 4) if you have to sleep on extra pillows at night in order to breathe.   Complete by: As directed    Call MD for:  difficulty breathing, headache or visual disturbances   Complete by: As directed    Call MD for:  extreme fatigue   Complete by: As directed    Call MD for:  hives   Complete by: As directed    Call MD for:  persistant dizziness or light-headedness   Complete by: As directed    Call MD for:  persistant nausea and  vomiting   Complete by: As directed    Call MD for:  redness, tenderness, or signs of infection (pain, swelling, redness, odor or green/yellow discharge around incision site)   Complete by: As directed    Call MD for:  severe uncontrolled pain   Complete by: As directed    Call MD for:  temperature >100.4   Complete by: As directed    Diet - low sodium heart healthy   Complete by: As directed    Discharge instructions   Complete by: As directed    1.  Review your medications carefully as they may have changed.  2.  Follow up with Cardiology as planned > will need repeat BMP, Mg at time of follow up  3.  Do not take Micardis or Magnesium until medications reviewed by Cardiology 4.  Call if you have new or worsening symptoms.  5.  Report to the ER immediately if concerns.   Increase activity slowly   Complete by: As directed      Follow-up Information    Lendon Colonel, NP Follow up on 04/17/2019.   Specialties: Nurse Practitioner, Radiology, Cardiology Why: Appt at 11:15 am to follow up on blood pressure regimen.  Please arrive at 11:00 for check in.  You will need to have a repeat BMP and Magnesium drawn at that time.   Contact information: [place] [address] [phone]           Allergies as of 04/15/2019      Reactions   Celebrex [celecoxib] Rash   Penicillins Hives   Did it involve swelling of the face/tongue/throat, SOB, or low BP? No Did it involve sudden or severe rash/hives, skin peeling, or any reaction on the inside of your mouth or nose? No Did you need to seek medical attention at a hospital or doctor's office? No When did it last happen?Several years ago If all above answers are "NO", may proceed with cephalosporin  use.      Medication List    STOP taking these medications   Magnesium 250 MG Tabs   telmisartan-hydrochlorothiazide 80-25 MG tablet Commonly known as: MICARDIS HCT     TAKE these medications    alendronate 70 MG tablet Commonly known as: FOSAMAX Take 70 mg by mouth every Sunday. Take with a full glass of water on an empty stomach.   aspirin EC 81 MG tablet Take 81 mg by mouth daily.   cholecalciferol 25 MCG (1000 UT) tablet Commonly known as: VITAMIN D3 Take 2,000 Units by mouth daily.   diltiazem 240 MG 24 hr capsule Commonly known as: DILACOR XR Take 1 capsule (240 mg total) by mouth daily.   insulin aspart protamine- aspart (70-30) 100 UNIT/ML injection Commonly known as: NOVOLOG MIX 70/30 Inject 40-55 Units into the skin 2 (two) times daily with a meal. Use sliding scale   liraglutide 18 MG/3ML Sopn Commonly known as: Victoza Inject 0.3 mLs (1.8 mg total) into the skin daily.   metoprolol succinate 50 MG 24 hr tablet Commonly known as: TOPROL-XL Take 1 tablet (50 mg total) by mouth daily.   pantoprazole 20 MG tablet Commonly known as: PROTONIX Take 20 mg by mouth daily.   rosuvastatin 40 MG tablet Commonly known as: CRESTOR Take 40 mg by mouth daily.         Disposition: Home.  No new home health needs identified prior to discharge.   Discharged Condition: Emily Mathews has met maximum benefit of inpatient care and is medically stable and cleared for discharge.  Patient is pending follow up as above.      Time spent on disposition:  35 Minutes.   Signed: Noe Gens, NP-C [facility] Pgr: [phone] Office: [phone]

## 2019-04-15 NOTE — Progress Notes (Signed)
 Initial Nutrition Assessment   RD working remotely.   DOCUMENTATION CODES:   Not applicable  INTERVENTION:   Follow-up and provide written and verbal low potassium diet education; pt will need to adjust potassium intake based on lab values as outpatient   NUTRITION DIAGNOSIS:   Food and nutrition related knowledge deficit related to other (see comment)(hyperkalemia and low potassium diet) as evidenced by other (comment)(consult for diet education).  GOAL:   Patient will meet greater than or equal to 90% of their needs   MONITOR:   PO intake, Labs, Weight trends  REASON FOR ASSESSMENT:   Consult Diet education  ASSESSMENT:   73 yo female admitted with severe symptomatic hyperkalemia with potassium >7.5. PMH includes HTN, DM, CM, GERD, CKD III  Per MD notes, pt on KCL supplement as well as ARB which could have further contributed to hyperkalemia.   Recorded po intake 100% of meals. Appetite good  No recent weight loss per weight encounters.   Labs: Creatinine 1.40, BUN wdl Meds: cholecalciferol, ss novolog  NUTRITION - FOCUSED PHYSICAL EXAM:  Unable to assess  Diet Order:   Diet Order            Diet Carb Modified Fluid consistency: Thin; Room service appropriate? Yes  Diet effective now              EDUCATION NEEDS:   Education needs have been addressed  Skin:  Skin Assessment: Reviewed RN Assessment  Last BM:  7/27  Height:   Ht Readings from Last 1 Encounters:  04/14/19 4' 11.5" (1.511 m)    Weight:   Wt Readings from Last 1 Encounters:  04/14/19 65.8 kg   BMI:  Body mass index is 28.8 kg/m.  Estimated Nutritional Needs:   Kcal:  1625-1850 kcals  Protein:  80-90 g  Fluid:  >/= 1.6 L    Kasch Borquez MS, RDN, LDN, CNSC [phone] Pager  [phone] Weekend/On-Call Pager

## 2019-04-15 NOTE — TOC Initial Note (Signed)
Transition of Care [facility]) - Initial/Assessment Note    Patient Details  Name: Emily Mathews MRN: [id number] Date of Birth: 02/10/1946  [facility] of [facility]) CM/SW Contact:    Bartholomew Crews, RN Phone Number: [phone] 04/15/2019, 2:32 PM  Clinical Narrative:                 Spoke with patient at the bedside. PTA home alone. Son lives close. Neighbors all check on one another. Works as Marine scientist at [employer]. Drives self. No HH or DME needs. No medication concerns. Patient to transition home today.   Expected Discharge Plan: Home/Self Care Barriers to Discharge: No Barriers Identified   Patient Goals and CMS Choice Patient states their goals for this hospitalization and ongoing recovery are:: home CMS Medicare.gov Compare Post Acute Care list provided to:: Patient Choice offered to / list presented to : NA  Expected Discharge Plan and Services Expected Discharge Plan: Home/Self Care In-house Referral: NA Discharge Planning Services: CM Consult Post Acute Care Choice: NA Living arrangements for the past 2 months: Single Family Home Expected Discharge Date: 04/15/19               DME Arranged: N/A DME Agency: NA       HH Arranged: NA [place] Agency: NA        Prior Living Arrangements/Services Living arrangements for the past 2 months: Single Family Home Lives with:: Self Patient language and need for interpreter reviewed:: Yes Do you feel safe going back to the place where you live?: Yes            Criminal Activity/Legal Involvement Pertinent to Current Situation/Hospitalization: No - Comment as needed  Activities of Daily Living Home Assistive Devices/Equipment: None ADL Screening (condition at time of admission) Patient's cognitive ability adequate to safely complete daily activities?: Yes Is the patient deaf or have difficulty hearing?: No Does the patient have difficulty seeing, even when wearing glasses/contacts?: No Does the patient have difficulty  concentrating, remembering, or making decisions?: No Patient able to express need for assistance with ADLs?: Yes Does the patient have difficulty dressing or bathing?: No Independently performs ADLs?: Yes (appropriate for developmental age) Does the patient have difficulty walking or climbing stairs?: No Weakness of Legs: None Weakness of Arms/Hands: None  Permission Sought/Granted                  Emotional Assessment Appearance:: Appears younger than stated age Attitude/Demeanor/Rapport: Engaged Affect (typically observed): Accepting Orientation: : Oriented to Situation, Oriented to  Time, Oriented to Place, Oriented to Self Alcohol / Substance Use: Not Applicable Psych Involvement: No (comment)  Admission diagnosis:  Hyperkalemia [E87.5] Bradycardia [R00.1] Hypotension, unspecified hypotension type [I95.9] Patient Active Problem List   Diagnosis Date Noted  . Hypotension   . Hyperkalemia 04/14/2019  . Chest pain   . Hypertrophic obstructive cardiomyopathy ([place]) 08/24/2018  . Bradycardia 08/23/2018  . Dyspnea 02/11/2014  . HTN (hypertension) 10/11/2013  . DM (diabetes mellitus) ([employer]) 10/11/2013   PCP:  Jefm Petty, MD Pharmacy:   [facility] [address] [place]. [place] [address] Phone: [phone] Fax: [phone]  [id number] [place], [place] [address] [place] [address] Phone: [phone] Fax: [phone]  [id number] - [place], [place] [address] [place] [address] Phone: [phone] Fax: [phone]     Social Determinants of Health (SDOH) Interventions  Readmission Risk Interventions No flowsheet data found.

## 2019-04-15 NOTE — Progress Notes (Signed)
Pt. discharged home. Picked up by son via car. All belongings sent home with patient. Education complete and all questions answered.

## 2019-04-15 NOTE — Progress Notes (Signed)
Patient ID: Emily Mathews, female   DOB: 09/25/1945, 73 y.o.   MRN: [id number] [facility] Progress Note   Assessment/ Plan:   1.  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  This was medically corrected with corresponding improvement of her cardiac rhythm.  Potentially may be able to discharge her home with outpatient follow-up with her primary care provider with labs in the next 3 to 4 days.  Will need labs every 2 to 4 weeks for the next 2 or 3 months to verify stability of potassium levels. 2.  Bradycardia: Secondary to hyperkalemia/medications and resolved. 3.  Chronic kidney disease stage III: Likely secondary to hypertension/DM-stable GFR.  We will sign off at this time, please call with questions or concerns.  Subjective:   Reports to be feeling better, denies any chest pain or shortness of breath   Objective:   BP (!) 158/93   Pulse 91   Temp 98.8 F (37.1 C) (Oral)   Resp 18   Ht 4' 11.5" (1.511 m)   Wt 65.8 kg   SpO2 94%   BMI 28.80 kg/m   Intake/Output Summary (Last 24 hours) at 04/15/2019 4401 Last data filed at 04/14/2019 2000 Gross per 24 hour  Intake 1120 ml  Output -  Net 1120 ml   Weight change:   Physical Exam: Gen: Comfortably resting in bed CVS: Pulse regular rhythm, normal rate, S1 and S2 normal Resp: Clear to auscultation, no rales/rhonchi Abd: Soft, flat, nontender Ext: No lower extremity edema  Imaging: Dg Chest Portable 1 View  Result Date: 04/14/2019 CLINICAL DATA:  73 year old female with history of hypertension and diabetes. Weakness and fatigue. EXAM: PORTABLE CHEST 1 VIEW COMPARISON:  Chest x-ray 04/14/2019. FINDINGS: Transcutaneous defibrillation pad projecting over the lower left hemithorax. Lung volumes are low. Widespread ill-defined bibasilar opacities and linear opacities throughout the right mid to lower lung and left lung base,  new compared to the recent prior examination. No evidence of pulmonary edema. Heart size is appears normal. Upper mediastinal contours are within normal limits. IMPRESSION: 1. Interval development of patchy ill-defined opacities in linear opacities in the lungs bilaterally which have rapidly developed since the prior study from 4 hours ago. Clinical correlation for signs and symptoms of recent aspiration or developing infection is recommended. Electronically Signed   By: Vinnie Langton M.D.   On: 04/14/2019 09:57   Dg Chest Portable 1 View  Result Date: 04/14/2019 CLINICAL DATA:  73 year old female with history of bradycardia. EXAM: PORTABLE CHEST 1 VIEW COMPARISON:  Chest x-ray 08/23/2018. FINDINGS: Lung volumes are low. No consolidative airspace disease. No pleural effusions. No pneumothorax. No pulmonary nodule or mass noted. Pulmonary vasculature and the cardiomediastinal silhouette are within normal limits. Transcutaneous defibrillator pad projecting over the lower left hemithorax. IMPRESSION: 1. Low lung volumes without radiographic evidence of acute cardiopulmonary disease. Electronically Signed   By: Vinnie Langton M.D.   On: 04/14/2019 05:54    Labs: BMET Recent Labs  Lab 04/14/19 0536 04/14/19 0651 04/14/19 0655 04/14/19 1119 04/14/19 1512 04/14/19 2004  NA 137  --  137 143 140 141  K 7.2* >7.5* 7.7* 5.5* 4.5 4.3  CL 106  --  109 113* 111 109  CO2 20*  --   --  21* 22 22  GLUCOSE 385*  --  332* 244* 225* 75  BUN 36*  --  37* 31* 26* 23  CREATININE  1.91*  --  1.60* 1.59* 1.48* 1.40*  CALCIUM 9.5  --   --  9.6 9.0 9.2   CBC Recent Labs  Lab 04/14/19 0536 04/14/19 0655 04/15/19 0639  WBC 9.0  --  8.6  HGB 12.8 11.9* 11.7*  HCT 43.6 35.0* 39.5  MCV 76.4*  --  74.8*  PLT 192  --  186    Medications:    . aspirin EC  81 mg Oral Daily  . cholecalciferol  2,000 Units Oral Daily  . heparin  5,000 Units Subcutaneous Q8H  . insulin aspart  0-15 Units Subcutaneous TID WC   . liraglutide  1.8 mg Subcutaneous Daily  . pantoprazole  20 mg Oral Daily  . sodium zirconium cyclosilicate  10 g Oral TID   Zetta BillsJay Basel Defalco, MD 04/15/2019, 8:21 AM

## 2019-04-16 ENCOUNTER — Telehealth: Payer: Self-pay | Admitting: Adult Health

## 2019-04-16 NOTE — Telephone Encounter (Signed)

## 2019-04-17 ENCOUNTER — Encounter: Payer: Self-pay | Admitting: Adult Health

## 2019-04-17 ENCOUNTER — Other Ambulatory Visit: Payer: Self-pay

## 2019-04-17 ENCOUNTER — Ambulatory Visit (INDEPENDENT_AMBULATORY_CARE_PROVIDER_SITE_OTHER): Payer: BC Managed Care – PPO | Admitting: Adult Health

## 2019-04-17 VITALS — BP 158/90 | HR 82 | Temp 98.8°F | Ht 59.5 in | Wt 148.4 lb

## 2019-04-17 DIAGNOSIS — E875 Hyperkalemia: Secondary | ICD-10-CM

## 2019-04-17 DIAGNOSIS — Z79899 Other long term (current) drug therapy: Secondary | ICD-10-CM

## 2019-04-17 DIAGNOSIS — I421 Obstructive hypertrophic cardiomyopathy: Secondary | ICD-10-CM | POA: Diagnosis not present

## 2019-04-17 MED ORDER — DILTIAZEM HCL ER COATED BEADS 300 MG PO CP24: 300.0000 mg | ORAL_CAPSULE | Freq: Every day | ORAL | 3 refills | Status: DC

## 2019-04-17 NOTE — Progress Notes (Signed)
Cardiology Office Note   Date:  04/17/2019   ID:  Emily Mathews, DOB [birth year], MRN [id number]  PCP:  Loyal JacobsonKalish, Michael, MD  Cardiologist:  Dr.Acharya  Chief Complaint :Post Hospitalization follow up, for hyperkalemia.   History of Present Illness: Emily Mathews is a 73 y.o. female who presents for ongoing assessment and management of hypertrophic cardiomyopathy, her son was also positive via genetic testing and also has HOCM.  Other history includes hypertension, hyperlipidemia, insulin-dependent diabetes, microcytic anemia GERD, and palpitations.  Emily Mathews had cardiac catheterization on 10/25/2018 by Dr. Cristal Deerhristopher End, which revealed mild nonobstructive CAD with HCM and dynamic mid cavity gradient, resting (mean 21, peak to peak 40 (, post PVC (mean 69, peak to peak 110).  With normal LVEDP.  The patient was recommended for medical therapy with avoidance of dehydration and afterload reduction.  On last office visit with Dr.Archarya, on 01/29/2019, the patient was doing well.  She had had no further symptoms.  She was continued on diltiazem, metoprolol, and telmisartan/HCTZ.  It was advised that she stop HCTZ if she had recurrent symptoms to avoid dehydration.  She was also advised to increase her fluids  She was recently hospitalized in the setting of hyperkalemia with a potassium of greater than 7.0.  Symptomatic bradycardia.  The patient was taken off of magnesium, potassium, and Micardis and continued on diltiazem and metoprolol at discharge.  Discharge potassium was 4.3.  Since returning home she is felt much better.  She has had no complaints of dizziness or chest discomfort.  Blood pressure has been higher though running in the 150s and 140s systolic.  Past Medical History:  Diagnosis Date  . Diabetes mellitus without complication (HCC)   . Heart murmur   . Hypertension   . Hypertrophic obstructive cardiomyopathy (HCC)     Past Surgical History:  Procedure Laterality Date   . ABDOMINAL HYSTERECTOMY    . CARDIAC CATHETERIZATION  12/29/2008   normal L main, LAD free of siease, Cfx free of disease, normal RCA, hypertrophic cardiomyopathy with 60mm subvalvular to apex gradient (Dr. Mervyn SkeetersA. Little)  . CESAREAN SECTION    . LEFT HEART CATH AND CORONARY ANGIOGRAPHY N/A 10/25/2018   Procedure: LEFT HEART CATH AND CORONARY ANGIOGRAPHY;  Surgeon: Yvonne KendallEnd, Christopher, MD;  Location: [facility];  Service: Cardiovascular;  Laterality: N/A;  . TRANSTHORACIC ECHOCARDIOGRAM  09/2012   EF 65-70%, severe septal hypertrophy, grade 1 diastolic dysfunction; LA in upper limites of normal in size     Current Outpatient Medications  Medication Sig Dispense Refill  . alendronate (FOSAMAX) 70 MG tablet Take 70 mg by mouth every Sunday. Take with a full glass of water on an empty stomach.     Marland Kitchen. aspirin EC 81 MG tablet Take 81 mg by mouth daily.    . cholecalciferol (VITAMIN D3) 25 MCG (1000 UT) tablet Take 2,000 Units by mouth daily.    . insulin aspart protamine- aspart (NOVOLOG MIX 70/30) (70-30) 100 UNIT/ML injection Inject 40-55 Units into the skin 2 (two) times daily with a meal. Use sliding scale    . liraglutide (VICTOZA) 18 MG/3ML SOPN Inject 0.3 mLs (1.8 mg total) into the skin daily.    . metoprolol succinate (TOPROL-XL) 50 MG 24 hr tablet Take 1 tablet (50 mg total) by mouth daily. 90 tablet 3  . pantoprazole (PROTONIX) 20 MG tablet Take 20 mg by mouth daily.    Marland Kitchen. diltiazem (CARDIZEM CD) 300 MG 24 hr capsule Take 1 capsule (300 mg total)  by mouth daily. 90 capsule 3  . rosuvastatin (CRESTOR) 40 MG tablet Take 40 mg by mouth daily.     No current facility-administered medications for this visit.     Allergies:   Celebrex [celecoxib] and Penicillins    Social History:  The patient  reports that she has never smoked. She has never used smokeless tobacco. She reports current alcohol use. She reports that she does not use drugs.   Family History:  The patient's family history  includes Diabetes in her mother and sister; Heart disease in her mother; Hyperlipidemia in her mother and sister; Hypertension in her mother and sister; Stroke in her mother and sister.    ROS: All other systems are reviewed and negative. Unless otherwise mentioned in H&P    PHYSICAL EXAM: VS:  BP (!) 158/90 (BP Location: Left Arm, Patient Position: Sitting, Cuff Size: Normal)   Pulse 82   Temp 98.8 F (37.1 C) (Temporal)   Ht 4' 11.5" (1.511 m)   Wt 148 lb 6.4 oz (67.3 kg)   SpO2 97%   BMI 29.47 kg/m  , BMI Body mass index is 29.47 kg/m.   GEN: Well nourished, well developed, in no acute distress HEENT: normal Neck: no JVD, carotid bruits, or masses Cardiac: RRR; no murmurs, rubs, or gallops,no edema  Respiratory:  Clear to auscultation bilaterally, normal work of breathing GI: soft, nontender, nondistended, + BS MS: no deformity or atrophy Skin: warm and dry, no rash Neuro:  Strength and sensation are intact Psych: euthymic mood, full affect   EKG: Not completed this office visit.  Recent Labs: 04/14/2019: BUN 23; Creatinine, Ser 1.40; Magnesium 2.0; Potassium 4.3; Sodium 141; TSH 3.567 04/15/2019: Hemoglobin 11.7; Platelets 186    Lipid Panel No results found for: CHOL, TRIG, HDL, CHOLHDL, VLDL, LDLCALC, LDLDIRECT    Wt Readings from Last 3 Encounters:  04/17/19 148 lb 6.4 oz (67.3 kg)  04/14/19 145 lb (65.8 kg)  10/25/18 150 lb (68 kg)      Other studies Reviewed: Cardiac cath 10/25/2018  Conclusions: 1. Mild, non-obstructive coronary artery disease, with 30% mid LAD stenosis. 2. Hemodynamic findings consistent with hypertrophic obstructive cardiomyopathy with dynamic mid-cavitary gradient. 3. Normal left ventricular filling pressure.  Recommendations: 1. Risk factor modification and medical therapy to prevent progression of CAD. 2. Medical therapy for HOCM (heart rate control as well as avoidance of dehydration and afterload reduction) and consideration of  EP consultation for ICD placement.   ASSESSMENT AND PLAN:  1.  Bradycardia: Secondary to hyperkalemia.  The patient had been on magnesium potassium and myocarditis.  These were discontinued with normalization of potassium.  I will check a BMET for ongoing assessment posthospitalization.  2.  Hypertension: Blood pressure remains elevated on follow-up at 158/90, I rechecked her blood pressure and it was 147/88.  I will increase her diltiazem to 300 mg p.o. daily.  She will take her blood pressure each day and record it at the same time every day.  The patient will be seen again in 1 month for reevaluation of her blood pressure control and her response to medication.  I will not restart ACE or ARB at this time.  3.  Insulin-dependent diabetes: Followed by PCP.  Current medicines are reviewed at length with the patient today.    Labs/ tests ordered today include: BMET  She does not want she went to the [facility] [place], ANP, AACC   04/17/2019 1:02 PM    [facility]  [facility] [address] Office [phone] Fax [phone]

## 2019-04-17 NOTE — Patient Instructions (Addendum)
Medication Instructions:  INCREASE- Diltiazem 300 mg by mouth daily  If you need a refill on your cardiac medications before your next appointment, please call your pharmacy.  Labwork: BMP HERE IN OUR OFFICE AT [facility] You will NOT need to fast   Take the provided lab slips with you to the lab for your blood draw.   When you have your labs (blood work) drawn today and your tests are completely normal, you will receive your results only by MyChart Message (if you have MyChart) -OR-  A paper copy in the mail.  If you have any lab test that is abnormal or we need to change your treatment, we will call you to review these results.  Testing/Procedures: None Ordered  Follow-Up: . Your physician recommends that you schedule a follow-up appointment in: 1 Month  At [facility], you and your health needs are our priority.  As part of our continuing mission to provide you with exceptional heart care, we have created designated Provider Care Teams.  These Care Teams include your primary Cardiologist (physician) and Advanced Practice Providers (APPs -  Physician Assistants and Nurse Practitioners) who all work together to provide you with the care you need, when you need it.  Thank you for choosing [facility] at [facility]!!

## 2019-04-18 LAB — BASIC METABOLIC PANEL
BUN/Creatinine Ratio: 17 (ref 12–28)
BUN: 26 mg/dL (ref 8–27)
CO2: 20 mmol/L (ref 20–29)
Calcium: 9.1 mg/dL (ref 8.7–10.3)
Chloride: 106 mmol/L (ref 96–106)
Creatinine, Ser: 1.53 mg/dL — ABNORMAL HIGH (ref 0.57–1.00)
GFR calc Af Amer: 39 mL/min/{1.73_m2} — ABNORMAL LOW (ref >59)
GFR calc non Af Amer: 34 mL/min/{1.73_m2} — ABNORMAL LOW (ref >59)
Glucose: 141 mg/dL — ABNORMAL HIGH (ref 65–99)
Potassium: 4.7 mmol/L (ref 3.5–5.2)
Sodium: 142 mmol/L (ref 134–144)

## 2019-04-22 ENCOUNTER — Telehealth: Payer: Self-pay | Admitting: *Deleted

## 2019-04-22 DIAGNOSIS — Z79899 Other long term (current) drug therapy: Secondary | ICD-10-CM

## 2019-04-22 NOTE — Telephone Encounter (Signed)
-----   Message from Lendon Colonel, NP sent at 04/21/2019 11:56 AM EDT ----- Labs are reviewed. Potassium remains normal, Creatinine slightly elevated. Will need to follow this or have PCP review as well. No changes to her regimen at this time.

## 2019-04-22 NOTE — Telephone Encounter (Signed)
BMP ordered and mail to pt, pt made aware of her blood work result send to pt PCP via [employer]

## 2019-04-24 ENCOUNTER — Ambulatory Visit: Payer: BC Managed Care – PPO | Admitting: Adult Health

## 2019-05-06 LAB — BASIC METABOLIC PANEL
BUN/Creatinine Ratio: 16 (ref 12–28)
BUN: 27 mg/dL (ref 8–27)
CO2: 21 mmol/L (ref 20–29)
Calcium: 9.7 mg/dL (ref 8.7–10.3)
Chloride: 103 mmol/L (ref 96–106)
Creatinine, Ser: 1.68 mg/dL — ABNORMAL HIGH (ref 0.57–1.00)
GFR calc Af Amer: 35 mL/min/{1.73_m2} — ABNORMAL LOW (ref >59)
GFR calc non Af Amer: 30 mL/min/{1.73_m2} — ABNORMAL LOW (ref >59)
Glucose: 100 mg/dL — ABNORMAL HIGH (ref 65–99)
Potassium: 4.7 mmol/L (ref 3.5–5.2)
Sodium: 140 mmol/L (ref 134–144)

## 2019-05-08 ENCOUNTER — Encounter: Payer: Self-pay | Admitting: Adult Health

## 2019-05-08 ENCOUNTER — Other Ambulatory Visit: Payer: Self-pay

## 2019-05-08 ENCOUNTER — Ambulatory Visit (INDEPENDENT_AMBULATORY_CARE_PROVIDER_SITE_OTHER): Payer: BC Managed Care – PPO | Admitting: Adult Health

## 2019-05-08 VITALS — BP 153/84 | HR 71 | Temp 97.2°F | Ht 59.5 in | Wt 147.4 lb

## 2019-05-08 DIAGNOSIS — I10 Essential (primary) hypertension: Secondary | ICD-10-CM | POA: Diagnosis not present

## 2019-05-08 DIAGNOSIS — G4733 Obstructive sleep apnea (adult) (pediatric): Secondary | ICD-10-CM | POA: Diagnosis not present

## 2019-05-08 MED ORDER — METOPROLOL SUCCINATE ER 50 MG PO TB24: ORAL_TABLET | ORAL | 3 refills | Status: AC

## 2019-05-08 NOTE — Patient Instructions (Signed)
Medication Instructions:  INCREASE- Metoprolol 1 1/2 tablet(75 mg) by mouth daily  If you need a refill on your cardiac medications before your next appointment, please call your pharmacy.  Labwork: None Ordered   Testing/Procedures: Your physician has recommended that you have a sleep study. This test records several body functions during sleep, including: brain activity, eye movement, oxygen and carbon dioxide blood levels, heart rate and rhythm, breathing rate and rhythm, the flow of air through your mouth and nose, snoring, body muscle movements, and chest and belly movement.  Follow-Up: . Your physician recommends that you schedule a follow-up appointment in: 2 Months with Jory Sims   At [facility], you and your health needs are our priority.  As part of our continuing mission to provide you with exceptional heart care, we have created designated Provider Care Teams.  These Care Teams include your primary Cardiologist (physician) and Advanced Practice Providers (APPs -  Physician Assistants and Nurse Practitioners) who all work together to provide you with the care you need, when you need it.  Thank you for choosing [facility] at [facility]!!

## 2019-05-08 NOTE — Progress Notes (Signed)
Cardiology Office Note   Date:  05/08/2019   ID:  Emily Mathews, DOB 1946-06-23, MRN [id number]  PCP:  Jefm Petty, MD  Cardiologist: Dr.Acharya  CC: Hypertension    History of Present Illness: Emily Mathews is a 73 y.o. female who presents for ongoing assessment and management of hypertrophic cardiomyopathy, she has a son who is also positive via genetic testing and also has HOCM.  She also has a history of hypertension, hyperlipidemia, insulin-dependent diabetes, microcytic anemia, GERD, and palpitations.  Emily Mathews had cardiac catheterization on 10/25/2018 by Dr. Harrell Gave End, which revealed mild nonobstructive CAD with HCM and dynamic mid cavity gradient, resting (mean 21, peak to peak 40 (, post PVC (mean 69, peak to peak 110).  With normal LVEDP.  The patient was recommended for medical therapy with avoidance of dehydration and afterload reduction.  She was hospitalized in July in the setting of hyperkalemia with potassium of greater than 7.  She also had symptomatic bradycardia.  Medications were adjusted with a discharge potassium of 4.3.  On last office visit she was feeling much better.  When I saw her last I repeated her BMET for ongoing assessment of her status post hospitalization.  Her blood pressure was found to be elevated at 158/90, and despite recheck it remained at 147/88.  I increased her diltiazem to 300 mg p.o. daily.  She was to take her blood pressure daily and recorded at the same time every day and bring it with her.  Her BP remained elevated despite the increased dose of diltiazem. She has had BP 176/96, 168/88. One or two normal range only. She works as a Quarry manager at night. She often has interrupted sleep during the day. No further headaches.   Past Medical History:  Diagnosis Date  . Diabetes mellitus without complication ([place])   . Heart murmur   . Hypertension   . Hypertrophic obstructive cardiomyopathy ([place])     Past Surgical History:  Procedure  Laterality Date  . ABDOMINAL HYSTERECTOMY    . CARDIAC CATHETERIZATION  12/29/2008   normal L main, LAD free of siease, Cfx free of disease, normal RCA, hypertrophic cardiomyopathy with 70mm subvalvular to apex gradient (Dr. Loni Muse. Little)  . CESAREAN SECTION    . LEFT HEART CATH AND CORONARY ANGIOGRAPHY N/A 10/25/2018   Procedure: LEFT HEART CATH AND CORONARY ANGIOGRAPHY;  Surgeon: Nelva Bush, MD;  Location: [place] CV LAB;  Service: Cardiovascular;  Laterality: N/A;  . TRANSTHORACIC ECHOCARDIOGRAM  09/2012   EF 65-70%, severe septal hypertrophy, grade 1 diastolic dysfunction; LA in upper limites of normal in size     Current Outpatient Medications  Medication Sig Dispense Refill  . alendronate (FOSAMAX) 70 MG tablet Take 70 mg by mouth every Sunday. Take with a full glass of water on an empty stomach.     Marland Kitchen aspirin EC 81 MG tablet Take 81 mg by mouth daily.    . cholecalciferol (VITAMIN D3) 25 MCG (1000 UT) tablet Take 2,000 Units by mouth daily.    Marland Kitchen diltiazem (CARDIZEM CD) 300 MG 24 hr capsule Take 1 capsule (300 mg total) by mouth daily. 90 capsule 3  . insulin aspart protamine- aspart (NOVOLOG MIX 70/30) (70-30) 100 UNIT/ML injection Inject 40-55 Units into the skin 2 (two) times daily with a meal. Use sliding scale    . liraglutide (VICTOZA) 18 MG/3ML SOPN Inject 0.3 mLs (1.8 mg total) into the skin daily.    . metoprolol succinate (TOPROL-XL) 50 MG 24 hr tablet Take  1 1/2 tablets( 75 mg) by mouth daily 135 tablet 3  . pantoprazole (PROTONIX) 20 MG tablet Take 20 mg by mouth daily.    . rosuvastatin (CRESTOR) 40 MG tablet Take 40 mg by mouth daily.     No current facility-administered medications for this visit.     Allergies:   Celebrex [celecoxib] and Penicillins    Social History:  The patient  reports that she has never smoked. She has never used smokeless tobacco. She reports current alcohol use. She reports that she does not use drugs.   Family History:  The patient's  family history includes Diabetes in her mother and sister; Heart disease in her mother; Hyperlipidemia in her mother and sister; Hypertension in her mother and sister; Stroke in her mother and sister.    ROS: All other systems are reviewed and negative. Unless otherwise mentioned in H&P    PHYSICAL EXAM: VS:  BP (!) 153/84   Pulse 71   Temp (!) 97.2 F (36.2 C)   Ht 4' 11.5" (1.511 m)   Wt 147 lb 6.4 oz (66.9 kg)   SpO2 96%   BMI 29.27 kg/m  , BMI Body mass index is 29.27 kg/m. GEN: Well nourished, well developed, in no acute distress HEENT: normal Neck: no JVD, carotid bruits, or masses Cardiac: RRR; no murmurs, rubs, or gallops,no edema  Respiratory:  Clear to auscultation bilaterally, normal work of breathing GI: soft, nontender, nondistended, + BS MS: no deformity or atrophy Skin: warm and dry, no rash Neuro:  Strength and sensation are intact Psych: euthymic mood, full affect   EKG: Not completed this office visit.   Recent Labs: 04/14/2019: Magnesium 2.0; TSH 3.567 04/15/2019: Hemoglobin 11.7; Platelets 186 05/06/2019: BUN 27; Creatinine, Ser 1.68; Potassium 4.7; Sodium 140    Lipid Panel No results found for: CHOL, TRIG, HDL, CHOLHDL, VLDL, LDLCALC, LDLDIRECT    Wt Readings from Last 3 Encounters:  05/08/19 147 lb 6.4 oz (66.9 kg)  04/17/19 148 lb 6.4 oz (67.3 kg)  04/14/19 145 lb (65.8 kg)      Other studies Reviewed:  Cardiac cath 10/25/2018  Conclusions: 1. Mild, non-obstructive coronary artery disease, with 30% mid LAD stenosis. 2. Hemodynamic findings consistent with hypertrophic obstructive cardiomyopathy with dynamic mid-cavitary gradient. 3. Normal left ventricular filling pressure.  Recommendations: 1. Risk factor modification and medical therapy to prevent progression of CAD. 2. Medical therapy for HOCM (heart rate control as well as avoidance of dehydration and afterload reduction) and consideration of EP consultation for ICD placement.      ASSESSMENT AND PLAN:  1.Hypertension: Will increase the metoprolol to 75 mg daily from 50 mg daily. Continue diltiazem. Will check her for OSA, as she has frequent snoring, sleepiness while at work, and mild DOE. She will take her BP's at home and call us with the results of the recordings in a couple of weeks.   2. OSA: Symptoms concerning for this, with difficult to control BP. Before adding more medication, will check her for OSA with Home Sleep Study.   3. Diabetes; Follow up by PCP   Current medicines are reviewed at length with the patient today.    Labs/ tests ordered today include: Home Sleep study.  Bettey MareKathryn M. Liborio NixonLawrence DNP, ANP, AACC   05/08/2019 4:51 PM    [facility] [address] Office [phone] Fax [phone]) [phone]

## 2019-05-14 ENCOUNTER — Telehealth: Payer: Self-pay | Admitting: *Deleted

## 2019-05-14 NOTE — Telephone Encounter (Signed)
Patient notified of [facility] appointment scheduled for 06/17/19 @ 1:00pm. No PA required per [facility] web portal.

## 2019-06-17 ENCOUNTER — Other Ambulatory Visit: Payer: Self-pay

## 2019-06-17 ENCOUNTER — Ambulatory Visit (HOSPITAL_BASED_OUTPATIENT_CLINIC_OR_DEPARTMENT_OTHER): Payer: BC Managed Care – PPO | Attending: Adult Health | Admitting: Cardiovascular Disease

## 2019-06-17 DIAGNOSIS — G4733 Obstructive sleep apnea (adult) (pediatric): Secondary | ICD-10-CM | POA: Diagnosis present

## 2019-06-17 DIAGNOSIS — I10 Essential (primary) hypertension: Secondary | ICD-10-CM | POA: Diagnosis not present

## 2019-06-24 ENCOUNTER — Encounter (HOSPITAL_BASED_OUTPATIENT_CLINIC_OR_DEPARTMENT_OTHER): Payer: Self-pay | Admitting: Cardiovascular Disease

## 2019-06-24 NOTE — Procedures (Signed)
    Patient Name: Emily Mathews, Emily Mathews Date: 06/17/2019 Gender: Female D.O.B: Oct 08, 1945 Age (years): 72 Referring Provider: Jory Sims NP Height (inches): 71 Interpreting Physician: Shelva Majestic MD, ABSM Weight (lbs): 148 RPSGT: Jacolyn Reedy BMI: 40 MRN: [id number] Neck Size: 14.00  CLINICAL INFORMATION Sleep Study Type: HST  Indication for sleep study: Hypertension, OSA  Epworth Sleepiness Score: 10  SLEEP STUDY TECHNIQUE A multi-channel overnight portable sleep study was performed. The channels recorded were: nasal airflow, thoracic respiratory movement, and oxygen saturation with a pulse oximetry. Snoring was also monitored.  MEDICATIONS     alendronate (FOSAMAX) 70 MG tablet             aspirin EC 81 MG tablet         cholecalciferol (VITAMIN D3) 25 MCG (1000 UT) tablet         diltiazem (CARDIZEM CD) 300 MG 24 hr capsule         insulin aspart protamine- aspart (NOVOLOG MIX 70/30) (70-30) 100 UNIT/ML injection         liraglutide (VICTOZA) 18 MG/3ML SOPN         metoprolol succinate (TOPROL-XL) 50 MG 24 hr tablet         pantoprazole (PROTONIX) 20 MG tablet         rosuvastatin (CRESTOR) 40 MG tablet      Patient self administered medications include: N/A.  SLEEP ARCHITECTURE Patient was studied for 322 minutes. The sleep efficiency was 100.0 % and the patient was supine for 99.9%. The arousal index was 0.0 per hour.  RESPIRATORY PARAMETERS The overall AHI was 24.8 per hour, with a central apnea index of 0.0 per hour.  The oxygen nadir was 85% during sleep.  CARDIAC DATA Mean heart rate during sleep was 75.3 bpm.  IMPRESSIONS - Moderate obstructive sleep apnea occurred during this study (AHI 24.8/h). - No significant central sleep apnea occurred during this study (CAI = 0.0/h). - Moderate oxygen desaturation to a nadir of 85%. - Patient snored 8.9% during the sleep.  DIAGNOSIS - Obstructive Sleep Apnea (327.23 [G47.33 ICD-10])   RECOMMENDATIONS - In this patient with significant cardiovascular comorbidities, recommend an in-lab CPAP titration study. If unable to schedule an in-lab titration then initiate AutoPAP with EPR of 3 at 7 - 20 cm water with heated humidification. - Effort should be made to optimize nasal and oropharyngeal patency. - Avoid alcohol, sedatives and other CNS depressants that may worsen sleep apnea and disrupt normal sleep architecture. - Sleep hygiene should be reviewed to assess factors that may improve sleep quality. - Weight management (BMI 40) and regular exercise should be initiated or continued. - Recommend a download after 30 days and sleep clinic evaluation after one month of therapy.   [Electronically signed] 06/24/2019 02:46 PM  Shelva Majestic MD, [facility], ABSM Diplomate, American Board of Sleep Medicine   NPI: [id number] [place] PH: [phone]   FX: [phone] [place]

## 2019-06-24 NOTE — Progress Notes (Signed)
Emily Mathews's sleep study was positive for sleep apnea. Dr Claiborne Billings wanted her to have CPAP titration study.  Emily Mathews, will you be following up on this? Do we need to let patient know of the results?   Thank you,  Emily Mathews

## 2019-06-25 ENCOUNTER — Telehealth: Payer: Self-pay | Admitting: *Deleted

## 2019-06-25 NOTE — Telephone Encounter (Signed)
Patient notified of HST results and recommendations. She agrees to having CPAP titration study.  

## 2019-06-26 ENCOUNTER — Other Ambulatory Visit: Payer: Self-pay | Admitting: Cardiovascular Disease

## 2019-06-26 ENCOUNTER — Encounter: Payer: Self-pay | Admitting: Gynecology

## 2019-06-26 DIAGNOSIS — I10 Essential (primary) hypertension: Secondary | ICD-10-CM

## 2019-06-26 DIAGNOSIS — G4733 Obstructive sleep apnea (adult) (pediatric): Secondary | ICD-10-CM

## 2019-07-05 ENCOUNTER — Other Ambulatory Visit (HOSPITAL_COMMUNITY): Payer: BC Managed Care – PPO

## 2019-07-09 ENCOUNTER — Encounter (HOSPITAL_BASED_OUTPATIENT_CLINIC_OR_DEPARTMENT_OTHER): Payer: BC Managed Care – PPO | Admitting: Cardiovascular Disease

## 2019-07-10 ENCOUNTER — Encounter: Payer: Self-pay | Admitting: Adult Health

## 2019-07-10 ENCOUNTER — Ambulatory Visit (INDEPENDENT_AMBULATORY_CARE_PROVIDER_SITE_OTHER): Payer: BC Managed Care – PPO | Admitting: Adult Health

## 2019-07-10 ENCOUNTER — Other Ambulatory Visit: Payer: Self-pay

## 2019-07-10 VITALS — BP 152/78 | HR 78 | Temp 97.0°F | Ht 59.5 in | Wt 146.0 lb

## 2019-07-10 DIAGNOSIS — I10 Essential (primary) hypertension: Secondary | ICD-10-CM

## 2019-07-10 DIAGNOSIS — G4733 Obstructive sleep apnea (adult) (pediatric): Secondary | ICD-10-CM | POA: Diagnosis not present

## 2019-07-10 DIAGNOSIS — I421 Obstructive hypertrophic cardiomyopathy: Secondary | ICD-10-CM | POA: Diagnosis not present

## 2019-07-10 DIAGNOSIS — I2510 Atherosclerotic heart disease of native coronary artery without angina pectoris: Secondary | ICD-10-CM | POA: Diagnosis not present

## 2019-07-10 MED ORDER — DILTIAZEM HCL ER COATED BEADS 360 MG PO CP24: 360.0000 mg | ORAL_CAPSULE | Freq: Every day | ORAL | 3 refills | Status: DC

## 2019-07-10 NOTE — Patient Instructions (Signed)
Medication Instructions:  INCREASE- Diltiazem 360 mg by mouth daily  If you need a refill on your cardiac medications before your next appointment, please call your pharmacy.  Labwork: None Ordered  Testing/Procedures: None Ordered  Follow-Up: IN 6 months Please call our office 2 months in advance,  to schedule this 6 Months appointment. In Person Cherlynn Kaiser, MD.    At [facility], you and your health needs are our priority.  As part of our continuing mission to provide you with exceptional heart care, we have created designated Provider Care Teams.  These Care Teams include your primary Cardiologist (physician) and Advanced Practice Providers (APPs -  Physician Assistants and Nurse Practitioners) who all work together to provide you with the care you need, when you need it.  Thank you for choosing [facility] at [facility]!!

## 2019-07-10 NOTE — Progress Notes (Signed)
Cardiology Office Note   Date:  07/10/2019   ID:  Emily Mathews, DOB 1946/07/30, MRN [id number]  PCP:  Loyal JacobsonKalish, Michael, MD  Cardiologist:  Pershing Proudr.Acarya  CC: Blood pressure follow-up   History of Present Illness: Emily Mathews is a 73 y.o. female who presents Emily Mathews is a 73 y.o. female who presents for ongoing assessment and management of hypertrophic cardiomyopathy, she has a son who is also positive via genetic testing and also has HOCM.  She also has a history of hypertension, hyperlipidemia, insulin-dependent diabetes, microcytic anemia, GERD, and palpitations.  Emily Mathews had cardiac catheterization on 10/25/2018 by Dr. Nanci PinahristopherEnd,which revealed mild nonobstructive CAD with HCM and dynamic mid cavity gradient, resting (mean 21, peak to peak 40 (, post PVC (mean 69, peak to peak 110). With normal LVEDP. The patient was recommended for medical therapy with avoidance of dehydration and afterload reduction.  Increased metoprolol to 75 mg daily and she was to have evaluation for OSA. This was completed with results revealing moderate OSA.She was recommended for CPAP titration study.  This is coordinated through Emily RiggWanda Waddell, CNA.   Emily Mathews did not have the CPAP titration study as she was unable to quarantine for post Covid testing.  She states that she works at [employer] and they are short staffed and is unable to take the time off in order to stay at home after Covid testing.  She would like to hold off on having the CPAP titration study completed until after staffing has improved and she is able to take time off for the quarantine.   Past Medical History:  Diagnosis Date  . Diabetes mellitus without complication (HCC)   . Heart murmur   . Hypertension   . Hypertrophic obstructive cardiomyopathy (HCC)     Past Surgical History:  Procedure Laterality Date  . ABDOMINAL HYSTERECTOMY    . CARDIAC CATHETERIZATION  12/29/2008   normal L main, LAD free of  siease, Cfx free of disease, normal RCA, hypertrophic cardiomyopathy with 60mm subvalvular to apex gradient (Dr. Mervyn SkeetersA. Little)  . CESAREAN SECTION    . LEFT HEART CATH AND CORONARY ANGIOGRAPHY N/A 10/25/2018   Procedure: LEFT HEART CATH AND CORONARY ANGIOGRAPHY;  Surgeon: Yvonne KendallEnd, Christopher, MD;  [facility];  Service: Cardiovascular;  Laterality: N/A;  . TRANSTHORACIC ECHOCARDIOGRAM  09/2012   EF 65-70%, severe septal hypertrophy, grade 1 diastolic dysfunction; LA in upper limites of normal in size     Current Outpatient Medications  Medication Sig Dispense Refill  . alendronate (FOSAMAX) 70 MG tablet Take 70 mg by mouth every Sunday. Take with a full glass of water on an empty stomach.     Marland Kitchen. aspirin EC 81 MG tablet Take 81 mg by mouth daily.    . cholecalciferol (VITAMIN D3) 25 MCG (1000 UT) tablet Take 2,000 Units by mouth daily.    Marland Kitchen. diltiazem (CARDIZEM CD) 360 MG 24 hr capsule Take 1 capsule (360 mg total) by mouth daily. 90 capsule 3  . insulin aspart protamine- aspart (NOVOLOG MIX 70/30) (70-30) 100 UNIT/ML injection Inject 40-55 Units into the skin 2 (two) times daily with a meal. Use sliding scale    . liraglutide (VICTOZA) 18 MG/3ML SOPN Inject 0.3 mLs (1.8 mg total) into the skin daily.    . metoprolol succinate (TOPROL-XL) 50 MG 24 hr tablet Take 1 1/2 tablets( 75 mg) by mouth daily 135 tablet 3  . pantoprazole (PROTONIX) 20 MG tablet Take 20 mg by mouth daily.    .Marland Kitchen  rosuvastatin (CRESTOR) 40 MG tablet Take 40 mg by mouth daily.     No current facility-administered medications for this visit.     Allergies:   Celebrex [celecoxib] and Penicillins    Social History:  The patient  reports that she has never smoked. She has never used smokeless tobacco. She reports current alcohol use. She reports that she does not use drugs.   Family History:  The patient's family history includes Diabetes in her mother and sister; Heart disease in her mother; Hyperlipidemia in her  mother and sister; Hypertension in her mother and sister; Stroke in her mother and sister.    ROS: All other systems are reviewed and negative. Unless otherwise mentioned in H&P    PHYSICAL EXAM: VS:  BP (!) 152/78   Pulse 78   Temp (!) 97 F (36.1 C)   Ht 4' 11.5" (1.511 m)   Wt 146 lb (66.2 kg)   SpO2 97%   BMI 28.99 kg/m  , BMI Body mass index is 28.99 kg/m. GEN: Well nourished, well developed, in no acute distress HEENT: normal Neck: no JVD, carotid bruits, or masses Cardiac: RRR; 1/6 systolic murmurs, rubs, or gallops,no edema  Respiratory:  Clear to auscultation bilaterally, normal work of breathing GI: soft, nontender, nondistended, + BS MS: no deformity or atrophy Skin: warm and dry, no rash Neuro:  Strength and sensation are intact Psych: euthymic mood, full affect   EKG: Not completed this office visit  Recent Labs: 04/14/2019: Magnesium 2.0; TSH 3.567 04/15/2019: Hemoglobin 11.7; Platelets 186 05/06/2019: BUN 27; Creatinine, Ser 1.68; Potassium 4.7; Sodium 140    Lipid Panel No results found for: CHOL, TRIG, HDL, CHOLHDL, VLDL, LDLCALC, LDLDIRECT    Wt Readings from Last 3 Encounters:  07/10/19 146 lb (66.2 kg)  06/17/19 148 lb (67.1 kg)  05/08/19 147 lb 6.4 oz (66.9 kg)      Other studies Reviewed: IMPRESSIONS - Moderate obstructive sleep apnea occurred during this study (AHI 24.8/h). - No significant central sleep apnea occurred during this study (CAI = 0.0/h). - Moderate oxygen desaturation to a nadir of 85%. - Patient snored 8.9% during the sleep.  DIAGNOSIS - Obstructive Sleep Apnea (327.23 [G47.33 ICD-10])  RECOMMENDATIONS - In this patient with significant cardiovascular comorbidities, recommend an in-lab CPAP titration study. If unable to schedule an in-lab titration then initiate AutoPAP with EPR of 3 at 7 - 20 cm water with heated humidification. - Effort should be made to optimize nasal and oropharyngeal patency. - Avoid alcohol,  sedatives and other CNS depressants that may worsen sleep apnea and disrupt normal sleep architecture. - Sleep hygiene should be reviewed to assess factors that may improve sleep quality. - Weight management (BMI 40) and regular exercise should be initiated or continued. - Recommend a download after 30 days and sleep clinic evaluation after one month of therapy.   Shelva Majestic MD, [facility], [place], American Board of Sleep Medicine  ASSESSMENT AND PLAN:  1.  Hypertension: Not well controlled.  Will increase diltiazem from 300 mg to 360 mg daily for better blood pressure response to keep at optimal levels.  Since she is not able to be placed on CPAP at this time I would like to keep her blood pressure better controlled until we are able to identify a good oxygen regimen at night.  Once this is completed we will have to have close follow-up to avoid hypotension.  2.  Moderate OSA: Noted per recent sleep study.  She is recommended  for a CPAP titration study.  She refuses this at this time as she is unable to quarantine after Covid test due to low staffing at her workplace.  She will let us know when she is ready to undergo this testing.  I have advised her to let us know as soon as possible so that we are able to support her breathing at night and help with better blood pressure control.  She verbalizes understanding.  3.  Hypertrophic cardiomyopathy; blood pressure is currently not well controlled, heart rate is normal.  We will continue current medication regimen with the exception of increased dose of diltiazem as above.  4.  Coronary artery disease: Nonobstructive per cath in February 2020.  Continue secondary prevention.   Current medicines are reviewed at length with the patient today.    Labs/ tests ordered today include: None  Emily Mathews. Liborio Nixon, ANP, AACC   07/10/2019 4:58 PM    [facility] [address] Office [phone] Fax [phone]  Notice: This dictation was prepared with Dragon dictation along with smaller phrase technology. Any transcriptional errors that result from this process are unintentional and may not be corrected upon review.

## 2019-07-15 ENCOUNTER — Telehealth: Payer: Self-pay | Admitting: Adult Health

## 2019-07-15 MED ORDER — DILTIAZEM HCL ER COATED BEADS 300 MG PO CP24: 300.0000 mg | ORAL_CAPSULE | Freq: Every day | ORAL | 1 refills | Status: AC

## 2019-07-15 NOTE — Telephone Encounter (Signed)
New message:    Patient calling stating that her medication was changed. Patient has been vomiting all 3 times. BP dropped last night very low and her HR. Please call patient back.

## 2019-07-15 NOTE — Telephone Encounter (Signed)
Called patient back-  She was advised of message of med changes. Will need 300 mg diltiazem sent, will send to pharmacy. Made appointment for 10/28 with PA. Patient will monitor BP until then.

## 2019-07-15 NOTE — Telephone Encounter (Signed)
Last OV was with Arnold Long on October/21. Diltiazem dose increased from 300mg  to 360mg .  Recommendation:   1. STOP diltiazem 360mg    2. Resume diltiazem 300mg  daily  3. Schedule f/u with APP next available to re-assess medication change.  4. Monitor BP and HR twice daily and bring records to follow up visit.

## 2019-07-15 NOTE — Telephone Encounter (Signed)
Thank you! ° °KL °

## 2019-07-15 NOTE — Telephone Encounter (Signed)
Last night- BP 96/64- HR 40, then dropped to 35. She states she has had issues with the low BP all throughout the night- she just started the new medication last night. Patient states since she has started the increased diltiazem- she states has had low BP 145/88- this morning went back up this morning- but she did things throughout the night to get the BP back up.  She took it yesterday at about 5:00 PM after she picked it up from the pharmacy. This was the last time she has taken it- and the only time, she feels the dose is too strong.  Patient states last night with the issues- she became SOB, she states she noticed palpitations last night when laying down. No other issues of chest pain, or swelling.  Advised patient I would route to [facility] and NP to advise. Patient voiced understanding.

## 2019-07-17 ENCOUNTER — Ambulatory Visit (INDEPENDENT_AMBULATORY_CARE_PROVIDER_SITE_OTHER): Payer: BC Managed Care – PPO | Admitting: Adult Health

## 2019-07-17 ENCOUNTER — Other Ambulatory Visit: Payer: Self-pay

## 2019-07-17 ENCOUNTER — Encounter: Payer: Self-pay | Admitting: Adult Health

## 2019-07-17 VITALS — BP 148/82 | HR 62 | Temp 97.3°F | Ht 59.5 in | Wt 150.0 lb

## 2019-07-17 DIAGNOSIS — G4733 Obstructive sleep apnea (adult) (pediatric): Secondary | ICD-10-CM

## 2019-07-17 DIAGNOSIS — I2510 Atherosclerotic heart disease of native coronary artery without angina pectoris: Secondary | ICD-10-CM

## 2019-07-17 DIAGNOSIS — I421 Obstructive hypertrophic cardiomyopathy: Secondary | ICD-10-CM

## 2019-07-17 DIAGNOSIS — I10 Essential (primary) hypertension: Secondary | ICD-10-CM

## 2019-07-17 NOTE — Progress Notes (Signed)
Cardiology Office Note   Date:  07/17/2019   ID:  Emily Mathews, DOB 1946-04-21, MRN [id number]  PCP:  Loyal JacobsonKalish, Michael, MD  Cardiologist:  Dr.Acharya  CC: Medication intolerance    History of Present Illness: Emily Mathews is a 73 y.o. female who presents for ongoing assessment and management of hypertrophic cardiomyopathy, she also has a son who was positive via genetic testing who also has HOCM.  Other history includes hypertension, hyperlipidemia, insulin-dependent diabetes, microcytic anemia, GERD, and palpitations.  Emily Mathews had cardiac catheterization on 10/25/2018 by Dr. Nanci PinahristopherEnd,which revealed mild nonobstructive CAD with HCM and dynamic mid cavity gradient, resting (mean 21, peak to peak 40 (, post PVC (mean 69, peak to peak 110). With normal LVEDP. The patient was recommended for medical therapy with avoidance of dehydration and afterload reduction.  Increased metoprolol to 75 mg daily and she was to have evaluation for OSA. This was completed with results revealing moderate OSA.She was recommended for CPAP titration study.  This is coordinated through Claiborne RiggWanda Waddell, CNA.   Emily Mathews did not have the CPAP titration study as she was unable to quarantine for post Covid testing. She states that she works at [employer] and they are short staffed and is unable to take the time off in order to stay at home after Covid testing.  She would like to hold off on having the CPAP titration study completed until after staffing has improved and she is able to take time off for the quarantine.  On last office visit dated 07/10/2019 the patient's blood pressure was not well controlled.  Because she was unable to have CPAP titration to assist with blood pressure control with consistent CPAP therapy I increased her diltiazem from 300 mg to 360 mg daily.  The patient called our office on 07/15/2019 stating that her heart rate dropped to the 40s and then blood pressure dropped to 96/64.   She felt the dose was too strong.  She did speak with our pharmacist who recommended that she go back to the original 300 mg dose of diltiazem and follow-up today for blood pressure recheck and symptom management.  She is doing much better with reduced dose of diltiazem back to 300 mg daily. No further symptoms.     Past Medical History:  Diagnosis Date   Diabetes mellitus without complication (HCC)    Heart murmur    Hypertension    Hypertrophic obstructive cardiomyopathy (HCC)     Past Surgical History:  Procedure Laterality Date   ABDOMINAL HYSTERECTOMY     CARDIAC CATHETERIZATION  12/29/2008   normal L main, LAD free of siease, Cfx free of disease, normal RCA, hypertrophic cardiomyopathy with 60mm subvalvular to apex gradient (Dr. Mervyn SkeetersA. Little)   CESAREAN SECTION     LEFT HEART CATH AND CORONARY ANGIOGRAPHY N/A 10/25/2018   Procedure: LEFT HEART CATH AND CORONARY ANGIOGRAPHY;  Surgeon: Yvonne KendallEnd, Christopher, MD;  Location: [facility] INVAS[facility] CV LAB;  Service: Cardiovascular;  Laterality: N/A;   TRANSTHORACIC ECHOCARDIOGRAM  09/2012   EF 65-70%, severe septal hypertrophy, grade 1 diastolic dysfunction; LA in upper limites of normal in size     Current Outpatient Medications  Medication Sig Dispense Refill   alendronate (FOSAMAX) 70 MG tablet Take 70 mg by mouth every Sunday. Take with a full glass of water on an empty stomach.      aspirin EC 81 MG tablet Take 81 mg by mouth daily.     cholecalciferol (VITAMIN D3) 25 MCG (1000 UT) tablet Take  2,000 Units by mouth daily.     diltiazem (CARDIZEM CD) 300 MG 24 hr capsule Take 1 capsule (300 mg total) by mouth daily. 90 capsule 1   insulin aspart protamine- aspart (NOVOLOG MIX 70/30) (70-30) 100 UNIT/ML injection Inject 40-55 Units into the skin 2 (two) times daily with a meal. Use sliding scale     liraglutide (VICTOZA) 18 MG/3ML SOPN Inject 0.3 mLs (1.8 mg total) into the skin daily.     metoprolol succinate (TOPROL-XL) 50 MG 24 hr  tablet Take 1 1/2 tablets( 75 mg) by mouth daily 135 tablet 3   pantoprazole (PROTONIX) 20 MG tablet Take 20 mg by mouth daily.     rosuvastatin (CRESTOR) 40 MG tablet Take 40 mg by mouth daily.     No current facility-administered medications for this visit.     Allergies:   Celebrex [celecoxib] and Penicillins    Social History:  The patient  reports that she has never smoked. She has never used smokeless tobacco. She reports current alcohol use. She reports that she does not use drugs.   Family History:  The patient's family history includes Diabetes in her mother and sister; Heart disease in her mother; Hyperlipidemia in her mother and sister; Hypertension in her mother and sister; Stroke in her mother and sister.    ROS: All other systems are reviewed and negative. Unless otherwise mentioned in H&P    PHYSICAL EXAM: VS:  BP (!) 148/82    Pulse 62    Temp (!) 97.3 F (36.3 C)    Ht 4' 11.5" (1.511 m)    Wt 150 lb (68 kg)    SpO2 96%    BMI 29.79 kg/m  , BMI Body mass index is 29.79 kg/m. GEN: Well nourished, well developed, in no acute distress HEENT: normal Neck: no JVD, carotid bruits, or masses Cardiac: RRR; no murmurs, rubs, or gallops,no edema  Respiratory:  Clear to auscultation bilaterally, normal work of breathing GI: soft, nontender, nondistended, + BS MS: no deformity or atrophy Skin: warm and dry, no rash Neuro:  Strength and sensation are intact Psych: euthymic mood, full affect   EKG:  Not completed today  Recent Labs: 04/14/2019: Magnesium 2.0; TSH 3.567 04/15/2019: Hemoglobin 11.7; Platelets 186 05/06/2019: BUN 27; Creatinine, Ser 1.68; Potassium 4.7; Sodium 140    Lipid Panel No results found for: CHOL, TRIG, HDL, CHOLHDL, VLDL, LDLCALC, LDLDIRECT    Wt Readings from Last 3 Encounters:  07/17/19 150 lb (68 kg)  07/10/19 146 lb (66.2 kg)  06/17/19 148 lb (67.1 kg)    Other studies Reviewed: [facility] 10/2018 Conclusions: 1. Mild, non-obstructive  coronary artery disease, with 30% mid LAD stenosis. 2. Hemodynamic findings consistent with hypertrophic obstructive cardiomyopathy with dynamic mid-cavitary gradient. 3. Normal left ventricular filling pressure.  Recommendations: 1. Risk factor modification and medical therapy to prevent progression of CAD. 2. Medical therapy for HOCM (heart rate control as well as avoidance of dehydration and afterload reduction) and consideration of EP consultation for ICD placement.  ASSESSMENT AND PLAN:  1. HOCM: She is doing better with decreased dose of diltiazem back to 300 mg daily. We will need to allow for permissive hypertension until CPAP titration is completed. She has significant hypotension and bradycardia on dose of diltiazem at 360 mg daily.  2. Hypertension: Slightly elevated today. Will not make any changes to her regimen at this time.   3. OSA: Awaiting CPAP titration study. She wants to wait on this as she cannot  take off of work to do a quarantine time period for in patient study.     Current medicines are reviewed at length with the patient today.    Labs/ tests ordered today include: None  Bettey Mare. Liborio Nixon, ANP, AACC   07/17/2019 4:17 PM    [facility] [address] Office [phone] Fax [phone]  Notice: This dictation was prepared with Dragon dictation along with smaller phrase technology. Any transcriptional errors that result from this process are unintentional and may not be corrected upon review.

## 2019-07-17 NOTE — Patient Instructions (Signed)
Medication Instructions:  Continue current medications  If you need a refill on your cardiac medications before your next appointment, please call your pharmacy.  Labwork: None Ordered   Testing/Procedures: None Ordered  Follow-Up: Your physician recommends that you schedule a follow-up appointment in: 3 Months with Dr Margaretann Loveless   At [facility], you and your health needs are our priority.  As part of our continuing mission to provide you with exceptional heart care, we have created designated Provider Care Teams.  These Care Teams include your primary Cardiologist (physician) and Advanced Practice Providers (APPs -  Physician Assistants and Nurse Practitioners) who all work together to provide you with the care you need, when you need it.  Thank you for choosing [facility] at [facility]!!
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
## 2019-10-17 ENCOUNTER — Other Ambulatory Visit: Payer: Self-pay

## 2019-10-17 ENCOUNTER — Encounter: Payer: Self-pay | Admitting: Internal Medicine

## 2019-10-17 ENCOUNTER — Ambulatory Visit (INDEPENDENT_AMBULATORY_CARE_PROVIDER_SITE_OTHER): Payer: BC Managed Care – PPO | Admitting: Internal Medicine

## 2019-10-17 VITALS — BP 161/87 | HR 62 | Temp 94.5°F | Ht 59.0 in | Wt 147.8 lb

## 2019-10-17 DIAGNOSIS — Z79899 Other long term (current) drug therapy: Secondary | ICD-10-CM | POA: Diagnosis not present

## 2019-10-17 DIAGNOSIS — I422 Other hypertrophic cardiomyopathy: Secondary | ICD-10-CM | POA: Diagnosis not present

## 2019-10-17 DIAGNOSIS — I10 Essential (primary) hypertension: Secondary | ICD-10-CM

## 2019-10-17 DIAGNOSIS — G4733 Obstructive sleep apnea (adult) (pediatric): Secondary | ICD-10-CM

## 2019-10-17 NOTE — Progress Notes (Signed)
Cardiology Office Note:    Date:  10/17/2019   ID:  Emily Mathews, DOB [birth year], MRN [id number]  PCP:  Jefm Petty, MD  Cardiologist:  Elouise Munroe, MD  Electrophysiologist:  None   Referring MD: Jefm Petty, MD   Chief Complaint: Follow up HCM  History of Present Illness:    Emily Mathews is a 74 y.o. 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 She also has a history of hypertension, hyperlipidemia, diabetes requiring insulin, microcytic anemia, GERD, and palpitations.   We performed a coronary angiogram in February for chest pain to exclude obstructive coronary disease vs symptoms from mid cavitary obstruction in HCM. She fortunately has mild, nonobstructive CAD. We then chose to focus on medical management of HCM.   She has done well since last visit. She tells me she has not had any recurrent symptoms of chest pain and rare palpitations. 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  She cannot take much time off work to isolate.  She does get Covid tested twice a week, however her exposure is high risk in a nursing home.  We have discussed strategies for timing her Covid test around a weekend off so that she can have her CPAP titration done.  Patient blood pressure is not optimally controlled today, however we have struggled to uptitrate therapy due to bradycardia and hypotension.  Past Medical History:  Diagnosis Date  . Diabetes mellitus without complication ([place])   . Heart murmur   . Hypertension   . Hypertrophic obstructive cardiomyopathy ([place])     Past Surgical History:  Procedure Laterality Date  . ABDOMINAL HYSTERECTOMY    .  CARDIAC CATHETERIZATION  12/29/2008   normal L main, LAD free of siease, Cfx free of disease, normal RCA, hypertrophic cardiomyopathy with 25mm subvalvular to apex gradient (Dr. Loni Muse. [place])  . CESAREAN SECTION    . LEFT HEART CATH AND CORONARY ANGIOGRAPHY N/A 10/25/2018   Procedure: LEFT HEART CATH AND CORONARY ANGIOGRAPHY;  Surgeon: Nelva Bush, MD;  Location: [facility];  Service: Cardiovascular;  Laterality: N/A;  . TRANSTHORACIC ECHOCARDIOGRAM  09/2012   EF 65-70%, severe septal hypertrophy, grade 1 diastolic dysfunction; LA in upper limites of normal in size    Current Medications: Current Meds  Medication Sig  . alendronate (FOSAMAX) 70 MG tablet Take 70 mg by mouth every Sunday. Take with a full glass of water on an empty stomach.   Marland Kitchen aspirin EC 81 MG tablet Take 81 mg by mouth daily.  . cholecalciferol (VITAMIN D3) 25 MCG (1000 UT) tablet Take 2,000 Units by mouth daily.  Marland Kitchen diltiazem (CARDIZEM CD) 300 MG 24 hr capsule Take 1 capsule (300 mg total) by mouth daily.  . insulin aspart protamine- aspart (NOVOLOG MIX 70/30) (70-30) 100 UNIT/ML injection Inject 40-55 Units into the skin 2 (two) times daily with a meal. Use sliding scale  . liraglutide (VICTOZA) 18 MG/3ML SOPN Inject 0.3 mLs (1.8 mg total) into the skin daily.  . metoprolol succinate (TOPROL-XL) 50 MG 24 hr tablet Take 1 1/2 tablets( 75 mg) by mouth daily  . pantoprazole (PROTONIX) 20 MG tablet Take 20 mg by mouth  daily.  . rosuvastatin (CRESTOR) 40 MG tablet Take 40 mg by mouth daily.     Allergies:   Celebrex [celecoxib] and Penicillins   Social History   Socioeconomic History  . Marital status: Legally Separated    Spouse name: Not on file  . Number of children: 5  . Years of education: 67  . Highest education level: Not on file  Occupational History  . Occupation: Scientist, research (medical): [employer]  Tobacco Use  . Smoking status: Never Smoker  . Smokeless tobacco: Never Used  Substance and Sexual  Activity  . Alcohol use: Yes    Comment: Occas  . Drug use: No  . Sexual activity: Not Currently    Comment: 1st intercourse 20 yo-5 partners  Other Topics Concern  . Not on file  Social History Narrative  . Not on file   Social Determinants of Health   Financial Resource Strain:   . Difficulty of Paying Living Expenses: Not on file  Food Insecurity:   . Worried About Programme researcher, broadcasting/film/video in the Last Year: Not on file  . Ran Out of Food in the Last Year: Not on file  Transportation Needs:   . Lack of Transportation (Medical): Not on file  . Lack of Transportation (Non-Medical): Not on file  Physical Activity:   . Days of Exercise per Week: Not on file  . Minutes of Exercise per Session: Not on file  Stress:   . Feeling of Stress : Not on file  Social Connections:   . Frequency of Communication with Friends and Family: Not on file  . Frequency of Social Gatherings with Friends and Family: Not on file  . Attends Religious Services: Not on file  . Active Member of Clubs or Organizations: Not on file  . Attends Banker Meetings: Not on file  . Marital Status: Not on file     Family History: The patient's family history includes Diabetes in her mother and sister; Heart disease in her mother; Hyperlipidemia in her mother and sister; Hypertension in her mother and sister; Stroke in her mother and sister.  ROS:   Please see the history of present illness.    All other systems reviewed and are negative.  EKGs/Labs/Other Studies Reviewed:    The following studies were reviewed today:  EKG: Sinus rhythm, right atrial enlargement, T wave inversions in the setting of left ventricular hypertrophy.  Recent Labs: 04/14/2019: Magnesium 2.0; TSH 3.567 04/15/2019: Hemoglobin 11.7; Platelets 186 05/06/2019: BUN 27; Creatinine, Ser 1.68; Potassium 4.7; Sodium 140  Recent Lipid Panel No results found for: CHOL, TRIG, HDL, CHOLHDL, VLDL, LDLCALC, LDLDIRECT  Physical Exam:     VS:  BP (!) 161/87   Pulse 62   Temp (!) 94.5 F (34.7 C)   Ht 4\' 11"  (1.499 m)   Wt 147 lb 12.8 oz (67 kg)   SpO2 99%   BMI 29.85 kg/m     Wt Readings from Last 5 Encounters:  10/17/19 147 lb 12.8 oz (67 kg)  07/17/19 150 lb (68 kg)  07/10/19 146 lb (66.2 kg)  06/17/19 148 lb (67.1 kg)  05/08/19 147 lb 6.4 oz (66.9 kg)     Constitutional: No acute distress Eyes: sclera non-icteric, normal conjunctiva and lids ENMT: normal dentition, moist mucous membranes Cardiovascular: regular rhythm, normal rate, 1/6 systolic murmur. S1 and S2 normal. Radial pulses normal bilaterally. No jugular venous distention.  Respiratory: clear to auscultation bilaterally GI : normal bowel  sounds, soft and nontender. No distention.   MSK: extremities warm, well perfused. No edema.  NEURO: grossly nonfocal exam, moves all extremities. PSYCH: alert and oriented x 3, normal mood and affect.      ASSESSMENT:    1. Hypertrophic cardiomyopathy (HCC)   2. OSA (obstructive sleep apnea)   3. Essential hypertension   4. Medication management    PLAN:    Reverse curve HCM - stable without symptoms at this time. Tolerating beta blockade. Avoid hypotension and dehydration, we discussed this today.  OSA - needs CPAP titration, we discussed ways to schedule COVID swab in coordination with time off from work to accomplish this.   HTN - not optimally controlled at this time. Treatment of OSA will help, since uptitration of therapy has been challenging due to hypotension, which may worsen ventricular cavity obstruction. We will follow this, overall she is asymptomatic.   Chest pain, resolved - nonobstructive CAD on cath, CP may be associated with HCM.   Total time of encounter: 30 minutes total time of encounter, including 20 minutes spent in face-to-face patient care. This time includes coordination of care and counseling regarding HTN, OSA. Remainder of non-face-to-face time involved reviewing chart  documents/testing relevant to the patient encounter and documentation in the medical record.  Weston Brass, MD [place]  [facility]   Medication Adjustments/Labs and Tests Ordered: Current medicines are reviewed at length with the patient today.  Concerns regarding medicines are outlined above.  No orders of the defined types were placed in this encounter.  No orders of the defined types were placed in this encounter.   Patient Instructions  Medication Instructions:  Continue same medications *If you need a refill on your cardiac medications before your next appointment, please call your pharmacy*  Lab Work: None ordered   Testing/Procedures: None ordered  Follow-Up: At [facility], you and your health needs are our priority.  As part of our continuing mission to provide you with exceptional heart care, we have created designated Provider Care Teams.  These Care Teams include your primary Cardiologist (physician) and Advanced Practice Providers (APPs -  Physician Assistants and Nurse Practitioners) who all work together to provide you with the care you need, when you need it.  Your next appointment:  6 months    Call in April to schedule July appointment   The format for your next appointment:  Office   Provider:  Dr. Jacques Navy     Needs appointment with [facility] for cpap titration

## 2019-10-17 NOTE — Patient Instructions (Signed)
Medication Instructions:  Continue same medications *If you need a refill on your cardiac medications before your next appointment, please call your pharmacy*  Lab Work: None ordered   Testing/Procedures: None ordered  Follow-Up: At [facility], you and your health needs are our priority.  As part of our continuing mission to provide you with exceptional heart care, we have created designated Provider Care Teams.  These Care Teams include your primary Cardiologist (physician) and Advanced Practice Providers (APPs -  Physician Assistants and Nurse Practitioners) who all work together to provide you with the care you need, when you need it.  Your next appointment:  6 months    Call in April to schedule July appointment   The format for your next appointment:  Office   Provider:  Dr. Jacques Navy     Needs appointment with [facility] for cpap titration

## 2019-10-18 ENCOUNTER — Telehealth: Payer: Self-pay | Admitting: *Deleted

## 2019-10-18 NOTE — Telephone Encounter (Signed)
Left message to return a call to discuss CPAP titration appointment details.

## 2019-10-23 NOTE — Telephone Encounter (Signed)
Left message to return a call to discuss sleep study appointment. 

## 2019-10-24 ENCOUNTER — Other Ambulatory Visit (HOSPITAL_COMMUNITY): Payer: BC Managed Care – PPO

## 2019-10-26 ENCOUNTER — Encounter (HOSPITAL_BASED_OUTPATIENT_CLINIC_OR_DEPARTMENT_OTHER): Payer: BC Managed Care – PPO | Admitting: Cardiovascular Disease

## 2019-11-15 ENCOUNTER — Other Ambulatory Visit (HOSPITAL_COMMUNITY)
Admission: RE | Admit: 2019-11-15 | Discharge: 2019-11-15 | Disposition: A | Discharge status: Home / Self Care | Payer: BC Managed Care – PPO | Source: Ambulatory Visit | Attending: Cardiovascular Disease | Admitting: Cardiovascular Disease

## 2019-11-15 DIAGNOSIS — Z01812 Encounter for preprocedural laboratory examination: Secondary | ICD-10-CM | POA: Insufficient documentation

## 2019-11-15 DIAGNOSIS — Z20822 Contact with and (suspected) exposure to covid-19: Secondary | ICD-10-CM | POA: Insufficient documentation

## 2019-11-15 LAB — SARS CORONAVIRUS 2 (TAT 6-24 HRS): SARS Coronavirus 2: NEGATIVE

## 2019-11-18 ENCOUNTER — Ambulatory Visit (HOSPITAL_BASED_OUTPATIENT_CLINIC_OR_DEPARTMENT_OTHER): Payer: BC Managed Care – PPO | Attending: Cardiovascular Disease | Admitting: Cardiovascular Disease

## 2019-11-18 ENCOUNTER — Other Ambulatory Visit: Payer: Self-pay

## 2019-11-18 DIAGNOSIS — Z7982 Long term (current) use of aspirin: Secondary | ICD-10-CM | POA: Diagnosis not present

## 2019-11-18 DIAGNOSIS — I10 Essential (primary) hypertension: Secondary | ICD-10-CM | POA: Insufficient documentation

## 2019-11-18 DIAGNOSIS — Z79899 Other long term (current) drug therapy: Secondary | ICD-10-CM | POA: Insufficient documentation

## 2019-11-18 DIAGNOSIS — G4733 Obstructive sleep apnea (adult) (pediatric): Secondary | ICD-10-CM | POA: Diagnosis present

## 2019-11-18 DIAGNOSIS — Z7901 Long term (current) use of anticoagulants: Secondary | ICD-10-CM | POA: Diagnosis not present

## 2019-11-24 ENCOUNTER — Encounter (HOSPITAL_BASED_OUTPATIENT_CLINIC_OR_DEPARTMENT_OTHER): Payer: Self-pay | Admitting: Cardiovascular Disease

## 2019-11-24 NOTE — Procedures (Signed)
Patient Name: Emily, Mathews Date: 11/18/2019 Gender: Female D.O.B: August 07, 1946 Age (years): 16 Referring Provider: Shelva Majestic MD, ABSM Height (inches): 60 Interpreting Physician: Shelva Majestic MD, ABSM Weight (lbs): 147 RPSGT: Carolin Coy BMI: 29 MRN: [id number] Neck Size: 13.50  CLINICAL INFORMATION The patient is referred for a CPAP titration to treat sleep apnea.  Date of HST: 06/17/2019:  AHI 24/h; O2 nadir 85%.  SLEEP STUDY TECHNIQUE As per the AASM Manual for the Scoring of Sleep and Associated Events v2.3 (April 2016) with a hypopnea requiring 4% desaturations.  The channels recorded and monitored were frontal, central and occipital EEG, electrooculogram (EOG), submentalis EMG (chin), nasal and oral airflow, thoracic and abdominal wall motion, anterior tibialis EMG, snore microphone, electrocardiogram, and pulse oximetry. Continuous positive airway pressure (CPAP) was initiated at the beginning of the study and titrated to treat sleep-disordered breathing.  MEDICATIONS alendronate (FOSAMAX) 70 MG tablet aspirin EC 81 MG tablet cholecalciferol (VITAMIN D3) 25 MCG (1000 UT) tablet diltiazem (CARDIZEM CD) 300 MG 24 hr capsule insulin aspart protamine- aspart (NOVOLOG MIX 70/30) (70-30) 100 UNIT/ML injection liraglutide (VICTOZA) 18 MG/3ML SOPN metoprolol succinate (TOPROL-XL) 50 MG 24 hr tablet pantoprazole (PROTONIX) 20 MG tablet rosuvastatin (CRESTOR) 40 MG tablet  Medications self-administered by patient taken the night of the study : N/A  TECHNICIAN COMMENTS Comments added by technician: PATIENT WAS ORDERED AS A CPAP TITRATION. Comments added by scorer: N/A  RESPIRATORY PARAMETERS Optimal PAP Pressure (cm): 17 AHI at Optimal Pressure (/hr): 0.0 Overall Minimal O2 (%): 85.0 Supine % at Optimal Pressure (%): 100 Minimal O2 at Optimal Pressure (%): 95.0   SLEEP ARCHITECTURE The study was initiated at 10:33:20 PM and ended at 4:48:54  AM.  Sleep onset time was 2.3 minutes and the sleep efficiency was 91.3%%. The total sleep time was 343 minutes.  The patient spent 10.6%% of the night in stage N1 sleep, 69.0%% in stage N2 sleep, 0.0%% in stage N3 and 20.4% in REM.Stage REM latency was 9.5 minutes  Wake after sleep onset was 30.3. Alpha intrusion was absent. Supine sleep was 81.36%.  CARDIAC DATA The 2 lead EKG demonstrated sinus rhythm. The mean heart rate was 65.3 beats per minute. Other EKG findings include: PVCs.  LEG MOVEMENT DATA The total Periodic Limb Movements of Sleep (PLMS) were 0. The PLMS index was 0.0. A PLMS index of <15 is considered normal in adults.  IMPRESSIONS - CPAP was initiated at 5 and was titrated to optimal PAP pressure at17 cm of water. AHI at 17 cm was 0, O2 nadir 95%. - Central sleep apnea was not noted during this titration (CAI = 0.2/h). - Moderate oxygen desaturations to a nadir of 85.0% at 5 cm water. - The patient snored with soft snoring volume during this titration study. - 2-lead EKG demonstrated: PVCs - Clinically significant periodic limb movements were not noted during this study. Arousals associated with PLMs were rare.  DIAGNOSIS - Obstructive Sleep Apnea (327.23 [G47.33 ICD-10])  RECOMMENDATIONS - Recommend an initial trial of CPAP therapy with EPR at 17 cm H2O with heated humidification. A Small size Resmed Full Face Mask AirFit F10 for Her mask was used for the titration study.  - Effort should be made to optimize nasal nasd oropharyngeal patency.  - Avoid alcohol, sedatives and other CNS depressants that may worsen sleep apnea and disrupt normal sleep architecture. - Sleep hygiene should be reviewed to assess factors that may improve sleep quality. - Weight management and regular exercise should be  initiated or continued. - Recommend a download in 30 days and sleep clinic evaluation after 4 weeks of therapy.   [Electronically signed] 11/24/2019 01:00 PM  Nicki Guadalajara  MD, [facility], ABSM Diplomate, American Board of Sleep Medicine   NPI: [id number]  [facility] PH: [phone]   FX: [phone] ACCREDITED BY THE AMERICAN ACADEMY OF SLEEP MEDICINE

## 2020-04-07 ENCOUNTER — Telehealth: Payer: Self-pay | Admitting: Cardiology

## 2020-04-07 NOTE — Telephone Encounter (Signed)
LVM for patient to return call to get scheduled with [facility] from recall list 

## 2020-07-23 ENCOUNTER — Telehealth: Payer: Self-pay | Admitting: *Deleted

## 2020-07-23 NOTE — Telephone Encounter (Signed)
A message was left, re: her follow up visit.
